# Patient Record
Sex: Female | Born: 1970
Health system: Southern US, Community
[De-identification: ages and names within clinical notes are randomized; demographics above are authoritative.]

## PROBLEM LIST (undated history)

## (undated) DIAGNOSIS — T7840XA Allergy, unspecified, initial encounter: Secondary | ICD-10-CM

## (undated) DIAGNOSIS — G43909 Migraine, unspecified, not intractable, without status migrainosus: Secondary | ICD-10-CM

## (undated) DIAGNOSIS — E039 Hypothyroidism, unspecified: Secondary | ICD-10-CM

## (undated) DIAGNOSIS — Z8639 Personal history of other endocrine, nutritional and metabolic disease: Secondary | ICD-10-CM

## (undated) HISTORY — DX: Personal history of other endocrine, nutritional and metabolic disease: Z86.39

## (undated) HISTORY — PX: APPENDECTOMY: SHX54

## (undated) HISTORY — DX: Hypothyroidism, unspecified: E03.9

## (undated) HISTORY — DX: Allergy, unspecified, initial encounter: T78.40XA

## (undated) HISTORY — PX: TONSILLECTOMY AND ADENOIDECTOMY: SHX28

## (undated) HISTORY — PX: SEPTOPLASTY: SUR1290

## (undated) HISTORY — PX: TONSILLECTOMY: SUR1361

## (undated) HISTORY — PX: WISDOM TOOTH EXTRACTION: SHX21

## (undated) HISTORY — PX: BREAST REDUCTION SURGERY: SHX8

## (undated) HISTORY — PX: TYMPANOPLASTY: SHX33

## (undated) HISTORY — PX: BREAST ENHANCEMENT SURGERY: SHX7

---

## 2007-01-11 ENCOUNTER — Encounter: Admission: RE | Admit: 2007-01-11 | Discharge: 2007-01-11 | Payer: Self-pay | Admitting: Oral Surgery

## 2007-07-31 ENCOUNTER — Ambulatory Visit: Payer: Self-pay | Admitting: Internal Medicine

## 2007-07-31 DIAGNOSIS — D649 Anemia, unspecified: Secondary | ICD-10-CM

## 2007-07-31 DIAGNOSIS — R011 Cardiac murmur, unspecified: Secondary | ICD-10-CM | POA: Insufficient documentation

## 2007-07-31 DIAGNOSIS — E039 Hypothyroidism, unspecified: Secondary | ICD-10-CM | POA: Insufficient documentation

## 2007-07-31 HISTORY — DX: Cardiac murmur, unspecified: R01.1

## 2007-07-31 HISTORY — DX: Anemia, unspecified: D64.9

## 2007-08-01 ENCOUNTER — Encounter: Payer: Self-pay | Admitting: Internal Medicine

## 2007-08-02 LAB — CONVERTED CEMR LAB
ALT: 42 units/L — ABNORMAL HIGH (ref 0–35)
AST: 43 units/L — ABNORMAL HIGH (ref 0–37)
Albumin: 4.5 g/dL (ref 3.5–5.2)
Alkaline Phosphatase: 39 units/L (ref 39–117)
BUN: 9 mg/dL (ref 6–23)
Basophils Absolute: 0.3 10*3/uL — ABNORMAL HIGH (ref 0.0–0.1)
Basophils Relative: 3.5 % — ABNORMAL HIGH (ref 0.0–1.0)
CO2: 29 meq/L (ref 19–32)
Chloride: 107 meq/L (ref 96–112)
Cholesterol: 208 mg/dL (ref 0–200)
Creatinine, Ser: 0.6 mg/dL (ref 0.4–1.2)
Direct LDL: 119 mg/dL
HCT: 34.1 % — ABNORMAL LOW (ref 36.0–46.0)
Hemoglobin: 11.9 g/dL — ABNORMAL LOW (ref 12.0–15.0)
MCHC: 34.8 g/dL (ref 30.0–36.0)
Monocytes Absolute: 0.3 10*3/uL (ref 0.2–0.7)
Monocytes Relative: 4.3 % (ref 3.0–11.0)
Potassium: 3.2 meq/L — ABNORMAL LOW (ref 3.5–5.1)
RBC: 3.69 M/uL — ABNORMAL LOW (ref 3.87–5.11)
RDW: 11.9 % (ref 11.5–14.6)
Total Bilirubin: 0.6 mg/dL (ref 0.3–1.2)
Total CHOL/HDL Ratio: 2.8
Total Protein: 7.1 g/dL (ref 6.0–8.3)
Triglycerides: 75 mg/dL (ref 0–149)
VLDL: 15 mg/dL (ref 0–40)

## 2007-08-06 ENCOUNTER — Encounter (INDEPENDENT_AMBULATORY_CARE_PROVIDER_SITE_OTHER): Payer: Self-pay | Admitting: *Deleted

## 2007-10-25 HISTORY — PX: COLONOSCOPY: SHX174

## 2008-08-26 ENCOUNTER — Ambulatory Visit: Payer: Self-pay | Admitting: Internal Medicine

## 2008-08-26 DIAGNOSIS — R198 Other specified symptoms and signs involving the digestive system and abdomen: Secondary | ICD-10-CM | POA: Insufficient documentation

## 2008-10-01 ENCOUNTER — Ambulatory Visit: Payer: Self-pay | Admitting: Internal Medicine

## 2008-10-01 DIAGNOSIS — K625 Hemorrhage of anus and rectum: Secondary | ICD-10-CM | POA: Insufficient documentation

## 2008-10-02 ENCOUNTER — Encounter (INDEPENDENT_AMBULATORY_CARE_PROVIDER_SITE_OTHER): Payer: Self-pay | Admitting: *Deleted

## 2008-10-02 LAB — CONVERTED CEMR LAB
Basophils Absolute: 0 10*3/uL (ref 0.0–0.1)
Basophils Relative: 0.5 % (ref 0.0–3.0)
Eosinophils Absolute: 0.3 10*3/uL (ref 0.0–0.7)
HCT: 37.6 % (ref 36.0–46.0)
Hemoglobin: 13 g/dL (ref 12.0–15.0)
Lymphocytes Relative: 22.8 % (ref 12.0–46.0)
MCHC: 34.5 g/dL (ref 30.0–36.0)
MCV: 93.9 fL (ref 78.0–100.0)
Monocytes Absolute: 0.5 10*3/uL (ref 0.1–1.0)
Neutro Abs: 3.3 10*3/uL (ref 1.4–7.7)
RBC: 4 M/uL (ref 3.87–5.11)
RDW: 11.5 % (ref 11.5–14.6)

## 2008-11-07 ENCOUNTER — Ambulatory Visit: Payer: Self-pay | Admitting: Internal Medicine

## 2009-02-09 ENCOUNTER — Telehealth (INDEPENDENT_AMBULATORY_CARE_PROVIDER_SITE_OTHER): Payer: Self-pay | Admitting: *Deleted

## 2009-02-11 ENCOUNTER — Telehealth: Payer: Self-pay | Admitting: Internal Medicine

## 2009-02-19 ENCOUNTER — Ambulatory Visit: Payer: Self-pay | Admitting: Internal Medicine

## 2009-03-26 ENCOUNTER — Telehealth (INDEPENDENT_AMBULATORY_CARE_PROVIDER_SITE_OTHER): Payer: Self-pay | Admitting: *Deleted

## 2009-07-15 ENCOUNTER — Telehealth (INDEPENDENT_AMBULATORY_CARE_PROVIDER_SITE_OTHER): Payer: Self-pay | Admitting: *Deleted

## 2009-08-21 ENCOUNTER — Ambulatory Visit: Payer: Self-pay | Admitting: Internal Medicine

## 2009-08-21 DIAGNOSIS — M542 Cervicalgia: Secondary | ICD-10-CM | POA: Insufficient documentation

## 2009-08-21 DIAGNOSIS — G479 Sleep disorder, unspecified: Secondary | ICD-10-CM

## 2009-08-22 DIAGNOSIS — J45909 Unspecified asthma, uncomplicated: Secondary | ICD-10-CM

## 2009-08-22 HISTORY — DX: Unspecified asthma, uncomplicated: J45.909

## 2009-09-09 ENCOUNTER — Ambulatory Visit: Payer: Self-pay | Admitting: Internal Medicine

## 2009-09-10 ENCOUNTER — Ambulatory Visit (HOSPITAL_BASED_OUTPATIENT_CLINIC_OR_DEPARTMENT_OTHER): Admission: RE | Admit: 2009-09-10 | Discharge: 2009-09-10 | Payer: Self-pay | Admitting: Internal Medicine

## 2009-09-12 ENCOUNTER — Encounter: Payer: Self-pay | Admitting: Internal Medicine

## 2009-09-13 ENCOUNTER — Ambulatory Visit: Payer: Self-pay | Admitting: Internal Medicine

## 2009-09-13 DIAGNOSIS — J310 Chronic rhinitis: Secondary | ICD-10-CM

## 2009-09-13 HISTORY — DX: Chronic rhinitis: J31.0

## 2009-10-09 ENCOUNTER — Ambulatory Visit: Payer: Self-pay | Admitting: Internal Medicine

## 2009-10-26 ENCOUNTER — Encounter: Payer: Self-pay | Admitting: Internal Medicine

## 2009-11-20 ENCOUNTER — Telehealth (INDEPENDENT_AMBULATORY_CARE_PROVIDER_SITE_OTHER): Payer: Self-pay | Admitting: *Deleted

## 2010-03-29 ENCOUNTER — Telehealth: Payer: Self-pay | Admitting: Internal Medicine

## 2010-03-29 DIAGNOSIS — D489 Neoplasm of uncertain behavior, unspecified: Secondary | ICD-10-CM | POA: Insufficient documentation

## 2010-04-12 ENCOUNTER — Telehealth: Payer: Self-pay | Admitting: Internal Medicine

## 2010-04-12 ENCOUNTER — Encounter: Payer: Self-pay | Admitting: Internal Medicine

## 2010-07-22 ENCOUNTER — Encounter (INDEPENDENT_AMBULATORY_CARE_PROVIDER_SITE_OTHER): Payer: Self-pay | Admitting: *Deleted

## 2010-09-06 ENCOUNTER — Ambulatory Visit: Payer: Self-pay | Admitting: Internal Medicine

## 2010-09-06 DIAGNOSIS — L299 Pruritus, unspecified: Secondary | ICD-10-CM | POA: Insufficient documentation

## 2010-09-06 DIAGNOSIS — L503 Dermatographic urticaria: Secondary | ICD-10-CM

## 2010-09-06 DIAGNOSIS — F39 Unspecified mood [affective] disorder: Secondary | ICD-10-CM | POA: Insufficient documentation

## 2010-09-06 HISTORY — DX: Dermatographic urticaria: L50.3

## 2010-11-23 NOTE — Consult Note (Signed)
Summary: [facility]   Imported By: Lanelle Bal 04/21/2010 10:07:36  _____________________________________________________________________  External Attachment:    Type:   Image     Comment:   External Document

## 2010-11-23 NOTE — Progress Notes (Signed)
Summary: Poditrist Referral  Phone Note Call from Patient Call back at Work Phone [phone]   Caller: Patient Reason for Call: Referral Summary of Call: Patient would like a referral to a poditrist, she has a growth on the top of her left foot and it hurts. She does not want to come in, she just wants the referral.    Initial call taken by: Harold Barban,  March 29, 2010 12:56 PM  Follow-up for Phone Call        Dr.Ellisyn Icenhower please advise Follow-up by: Shonna Chock,  March 29, 2010 1:21 PM  Additional Follow-up for Phone Call Additional follow up Details #1::        see Referral  New Problems: NEOPLASM OF UNCERTAIN BEHAVIOR SITE UNSPECIFIED (ICD-238.9)   New Problems: NEOPLASM OF UNCERTAIN BEHAVIOR SITE UNSPECIFIED (ICD-238.9)

## 2010-11-23 NOTE — Progress Notes (Signed)
Summary: nos appt  Phone Note Call from Patient   Caller: [email or web address]  Call For: Deshonda Cryderman Summary of Call: LMTCB x2 to rsc from 6/17. Initial call taken by: Darletta Moll,  April 12, 2010 2:49 PM

## 2010-11-23 NOTE — Assessment & Plan Note (Signed)
Summary: FOR ITCHY SKIN//PH   Vital Signs:  Patient profile:   40 year old female Weight:      136 pounds BMI:     23.07 Temp:     98.3 degrees F oral Pulse rate:   72 / minute Resp:     15 per minute BP sitting:   94 / 60  (left arm) Cuff size:   large  Vitals Entered By: Shonna Chock CMA (September 06, 2010 9:10 AM) CC: Itchy skin, patient seen recommended specialist and no relief, Rash   Primary Care Provider:  Dr Alwyn Ren  CC:  Itchy skin, patient seen recommended specialist and no relief, and Rash.  History of Present Illness: She has seen Dr Courtney Heys, ENT/ Allergy  & Dr Fannie Knee for diffuse pruritis; she is  on sublingual agents for documented allergies.The pruritis is now disturbing her sleep; she questions stress as etiology.Rx: Benadryl & hydroxyzine have been somewhat beneficial.A famiy member gave her Lorazepam which was effective.  The patient reports welts only after scratching. The  itching  is located on the face/trunk/limbs diffusely.  The patient denies the following symptoms: fever, headache, facial swelling, tongue swelling, difficulty breathing, abdominal pain, nausea, vomiting, diarrhea, and arthralgias.    Current Medications (verified): 1)  None  Allergies: 1)  ! Codeine  Physical Exam  General:  well-nourished,in no acute distress; alert,appropriate and cooperative throughout examination Eyes:  No corneal or conjunctival inflammation noted. Perrla. Nose:  External nasal examination shows no deformity or inflammation. Nasal mucosa are pink and moist without lesions or exudates. Mouth:  Oral mucosa and oropharynx without lesions or exudates.  Teeth in good repair. L soft palate lower than R Lungs:  Normal respiratory effort, chest expands symmetrically. Lungs are clear to auscultation, no crackles or wheezes. Heart:  regular rhythm, no murmur, no gallop, no rub, no JVD, and bradycardia.   Abdomen:  Bowel sounds positive,abdomen soft and non-tender without  masses, organomegaly or hernias noted. Skin:  Dermatographia Cervical Nodes:  No lymphadenopathy noted Axillary Nodes:  No palpable lymphadenopathy Psych:  memory intact for recent and remote, normally interactive, good eye contact, not anxious appearing, and not depressed appearing.     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 1/2-1 at bedtime prn 2)  Citalopram Hydrobromide 20 Mg Tabs (Citalopram hydrobromide) .Marland Kitchen.. 1 once daily 3)  Sumatriptan Succinate 50 Mg Tabs (Sumatriptan succinate) .Marland Kitchen.. 1 once daily as needed  Patient Instructions: 1)  Assess response to medical regimen Prescriptions: CITALOPRAM HYDROBROMIDE 20 MG TABS (CITALOPRAM HYDROBROMIDE) 1 once daily  #30 x 2   Entered and Authorized by:   Marga Melnick MD   Signed by:   Marga Melnick MD on 09/06/2010   Method used:   Print then Give to Patient   RxID:   [id number] LORAZEPAM 0.5 MG TABS (LORAZEPAM) 1/2-1 at bedtime prn  #30 x 2   Entered and Authorized by:   Marga Melnick MD   Signed by:   Marga Melnick MD on 09/06/2010   Method used:   Print then Give to Patient   RxID:   [id number]    Orders Added: 1)  Est. Patient Level III [95284]

## 2010-11-23 NOTE — Miscellaneous (Signed)
Summary: pnuemovax  Clinical Lists Changes  Observations: Added new observation of PNEUMOVAX: given at [facility] (07/22/2010 9:53)      Immunization History:  Pneumovax Immunization History:    Pneumovax:  given at [facility] (07/22/2010)

## 2010-11-23 NOTE — Progress Notes (Signed)
Summary: not available PANLOR DC 356.02-21-15 MG   Phone Note Refill Request   Refills Requested: Medication #1:  PANLOR DC 356.02-21-15 MG CAPS take 1-2 q 4 hrs as needed. [facility] [address]--PH-[phone]  Initial call taken by: Freddy Jaksch,  February 11, 2009 2:25 PM  Follow-up for Phone Call        wait for PCP, got 12 tabs 2 days ago Rossiter E. Paz MD  February 11, 2009 3:53 PM  Follow-up by: Marga Melnick MD,  February 11, 2009 4:12 PM  Additional Follow-up for Phone Call Additional follow up Details #1::        the headaches require evaluation with this frequency/ level of pain med  Additional Follow-up by: Marga Melnick MD,  February 11, 2009 4:13 PM    Additional Follow-up for Phone Call Additional follow up Details #2::    dr Nichol Ator please advise   med no longer available per pharmacy please advise on new med.original rx  never filled or pick-up......................Marland KitchenFelecia Deloach CMA  February 12, 2009 12:11 PM  Additional Follow-up for Phone Call Additional follow up Details #3:: Details for Additional Follow-up Action Taken: Darvocet N100 #30 1-2 q 4-6 hrs as needed  Additional Follow-up by: Marga Melnick MD,  February 12, 2009 2:01 PM  New/Updated Medications: DARVOCET-N 100 100-650 MG TABS (PROPOXYPHENE N-APAP) take 1-2 q 4-6 hrs as needed   Prescriptions: DARVOCET-N 100 100-650 MG TABS (PROPOXYPHENE N-APAP) take 1-2 q 4-6 hrs as needed  #30 x 0   Entered by:   Jeremy Johann CMA   Authorized by:   Marga Melnick MD   Signed by:   Jeremy Johann CMA on 02/12/2009   Method used:   Printed then faxed to ...       [facility] [address] [id number]* (retail)       [address]       [place], [place]  [address]       Ph: [phone]       Fax: [phone]   RxID:   [id number]

## 2010-11-23 NOTE — Progress Notes (Signed)
Summary:  change to generic med  Phone Note Refill Request Message from:  Patient  Refills Requested: Medication #1:  SYMBICORT 160-4.5 MCG/ACT AERO 1-2 inhalations eveery 12 hrs pt states that insurance will not covered med and would like to get it change to something generic that insurance will cover. dr hopper pls advise................Marland KitchenFelecia Deloach CMA  November 20, 2009 10:30 AM   pt use [facility] [place]  Initial call taken by: Jeremy Johann CMA,  November 20, 2009 10:30 AM  Follow-up for Phone Call        which are drug plan options as per her Pharmacist Follow-up by: Marga Melnick MD,  November 20, 2009 4:36 PM  Additional Follow-up for Phone Call Additional follow up Details #1::        PT WILL CONTACT PHARAMACY TO GET DRUG OPTION FAXED TO [place]. .............Marland KitchenFelecia Deloach CMA  November 20, 2009 5:07 PM

## 2010-11-23 NOTE — Letter (Signed)
Summary: [facility]  [facility]   Imported By: Lanelle Bal 11/02/2009 09:50:54  _____________________________________________________________________  External Attachment:    Type:   Image     Comment:   External Document

## 2011-07-15 ENCOUNTER — Other Ambulatory Visit: Payer: Self-pay | Admitting: Internal Medicine

## 2011-07-15 ENCOUNTER — Ambulatory Visit (INDEPENDENT_AMBULATORY_CARE_PROVIDER_SITE_OTHER): Payer: BC Managed Care – PPO | Admitting: Internal Medicine

## 2011-07-15 ENCOUNTER — Encounter: Payer: Self-pay | Admitting: Internal Medicine

## 2011-07-15 VITALS — BP 112/68 | HR 102 | Temp 98.3°F | Resp 12 | Ht 64.75 in | Wt 150.0 lb

## 2011-07-15 DIAGNOSIS — Z0000 Encounter for general adult medical examination without abnormal findings: Secondary | ICD-10-CM

## 2011-07-15 DIAGNOSIS — Z136 Encounter for screening for cardiovascular disorders: Secondary | ICD-10-CM

## 2011-07-15 NOTE — Progress Notes (Signed)
Subjective:    Patient ID: Sheryl Nichols, female    DOB: 12/08/1970, 40 y.o.   MRN: [id number]  HPI  Sheryl Nichols  is here for a physical;acute issues include fatigue     Review of Systems FATIGUE: Onset: since mid May   Fatigue @ rest : yes , but worse  with exertion  Primarily motivational fatigue: yes  Primarily physical fatigue: no Symptoms: Fever/ chills : no  Night sweats: no                                                                                            Vision changes ( blurred/ double/ loss): no                                                                                                  Hoarseness or swallowing dysfunction: no                                                                                         Bowel changes( constipation/ diarrhea): no                                                                                     Weight change: yes, 12 # gain since last OV   Exertional chest pain: no  Dyspnea on exertion: no  Cough: no  Hemoptysis: no  New medications: no  Leg swelling: no  Orthopnea: no  PND: no  Melena/ rectal bleeding: no  Adenopathy: no  Severe snoring: no ; Neti pot used for congestion Daytime sleepiness: yes,   Skin / hair / nail changes: no  Temperature intolerance( heat/ cold) : cold intolerance  Feeling depressed: no  Anhedonia: no  Altered appetite: stays hungry  Poor sleep/ Apnea : no ( official test was neg) Abnormal bruising / bleeding or enlarged lymph nodes: no                                                                         PMH/ FH of thyroid disease: yes; mother low thyroid    She strained her back in May;this  initially improved but it has progressed since with pain in the R buttock. She'll be seen Dr. Shon Baton, and orthopedic back specialist. She's been self treating it with heat, cold,muscle relaxant & NSAIDS.        Objective:   Physical Exam Gen.: Healthy and well-nourished in appearance. Alert, appropriate and cooperative throughout exam. Head: Normocephalic without obvious abnormalities  Eyes: No corneal or conjunctival inflammation noted. Pupils equal round reactive to light and accommodation. Fundal exam is benign without hemorrhages, exudate, papilledema. Extraocular motion intact. Vision grossly normal. No lid lag Ears: External  ear exam reveals no significant lesions or deformities. Canals clear .TMs normal. Hearing is grossly normal bilaterally. Nose: External nasal exam reveals no deformity or inflammation. Nasal mucosa are pink and moist. No lesions or exudates noted. Septum  normal  Mouth: Oral mucosa and oropharynx reveal no lesions or exudates. Teeth in good repair. The left posterior pharynx is slightly lower than the right; there is no airway compromise. Neck: No deformities, masses, or tenderness noted. Range of motion &. Thyroid  normal. Lungs: Normal respiratory effort; chest expands symmetrically. Lungs are clear to auscultation without rales, wheezes, or increased work of breathing. Heart: Normal rate and rhythm. Normal S1 and S2. No gallop, click, or rub. Grade 1/2 -1 systolic  murmur. Abdomen: Bowel sounds normal; abdomen soft and nontender. No masses, organomegaly or hernias noted. Genitalia: Dr Billy Coast   .                                                                                   Musculoskeletal/extremities: No deformity or scoliosis noted of  the thoracic or lumbar spine. No clubbing, cyanosis, edema, or deformity noted. Range of motion  normal .Tone & strength  normal.Joints normal. Nail health  good. She is able to lie back on exam table and sit up without help. She has negative straight leg raising to 90 bilaterally. Vascular: Carotid, radial artery, dorsalis pedis and  posterior tibial pulses are full and equal. No bruits present. Neurologic: Alert and oriented x3. Deep  tendon reflexes symmetrical and normal. Gait including heel and toe walking is normal         Skin: Intact without suspicious lesions or rashes. Lymph: No cervical, axillary lymphadenopathy present. Psych: Mood and affect are normal. Normally interactive  Assessment & Plan:  #1 comprehensive physical exam; no acute findings #2 see Problem List with Assessments & Recommendations #3 fatigue #4 right buttocks pain; rule out piriformis syndrome. No evidence of disc issues. Plan: see Orders

## 2011-07-15 NOTE — Patient Instructions (Signed)
Preventive Health Care: Exercise  30-45  minutes a day, 3-4 days a week. Walking is especially valuable in preventing Osteoporosis. Eat a low-fat diet with lots of fruits and vegetables, up to 7-9 servings per day. Consume less than 30 grams of sugar per day from foods & drinks with High Fructose Corn Syrup as # 1,2,3 or #4 on label.  The best exercises for the low back include freestyle swimming, stretch aerobics, and yoga.  Please  schedule fasting Labs : BMET,Lipids, hepatic panel, CBC & dif, TSH, free T4 (V70.0)

## 2011-07-18 ENCOUNTER — Other Ambulatory Visit: Payer: Self-pay | Admitting: Internal Medicine

## 2011-07-18 ENCOUNTER — Other Ambulatory Visit: Payer: BC Managed Care – PPO

## 2011-07-18 NOTE — Progress Notes (Signed)
ERROR orders already placed as future orders.

## 2011-07-19 ENCOUNTER — Other Ambulatory Visit (INDEPENDENT_AMBULATORY_CARE_PROVIDER_SITE_OTHER): Payer: BC Managed Care – PPO

## 2011-07-19 DIAGNOSIS — Z0000 Encounter for general adult medical examination without abnormal findings: Secondary | ICD-10-CM

## 2011-07-19 LAB — CBC WITH DIFFERENTIAL/PLATELET
Basophils Absolute: 0 10*3/uL (ref 0.0–0.1)
Lymphocytes Relative: 35.9 % (ref 12.0–46.0)
Lymphs Abs: 1.7 10*3/uL (ref 0.7–4.0)
MCHC: 33.6 g/dL (ref 30.0–36.0)
MCV: 95.7 fl (ref 78.0–100.0)
Neutrophils Relative %: 46.7 % (ref 43.0–77.0)
Platelets: 280 10*3/uL (ref 150.0–400.0)
WBC: 4.6 10*3/uL (ref 4.5–10.5)

## 2011-07-19 LAB — BASIC METABOLIC PANEL
CO2: 26 mEq/L (ref 19–32)
Calcium: 9.4 mg/dL (ref 8.4–10.5)
Creatinine, Ser: 0.7 mg/dL (ref 0.4–1.2)
GFR: 98.63 mL/min (ref >60.00)
Sodium: 145 mEq/L (ref 135–145)

## 2011-07-19 LAB — HEPATIC FUNCTION PANEL
ALT: 45 U/L — ABNORMAL HIGH (ref 0–35)
AST: 37 U/L (ref 0–37)
Bilirubin, Direct: 0 mg/dL (ref 0.0–0.3)
Total Bilirubin: 0.4 mg/dL (ref 0.3–1.2)
Total Protein: 7.2 g/dL (ref 6.0–8.3)

## 2011-07-19 LAB — LIPID PANEL
HDL: 76.8 mg/dL (ref >39.00)
Triglycerides: 69 mg/dL (ref 0.0–149.0)

## 2011-07-19 NOTE — Progress Notes (Signed)
Labs only

## 2011-07-20 NOTE — Progress Notes (Signed)
Labs only

## 2012-02-15 ENCOUNTER — Emergency Department (INDEPENDENT_AMBULATORY_CARE_PROVIDER_SITE_OTHER): Payer: BC Managed Care – PPO

## 2012-02-15 ENCOUNTER — Encounter (HOSPITAL_BASED_OUTPATIENT_CLINIC_OR_DEPARTMENT_OTHER): Payer: Self-pay | Admitting: Emergency Medicine

## 2012-02-15 ENCOUNTER — Emergency Department (HOSPITAL_BASED_OUTPATIENT_CLINIC_OR_DEPARTMENT_OTHER)
Admission: EM | Admit: 2012-02-15 | Discharge: 2012-02-15 | Disposition: A | Discharge status: Home / Self Care | Payer: BC Managed Care – PPO | Attending: Emergency Medicine | Admitting: Emergency Medicine

## 2012-02-15 DIAGNOSIS — J45909 Unspecified asthma, uncomplicated: Secondary | ICD-10-CM | POA: Insufficient documentation

## 2012-02-15 DIAGNOSIS — IMO0002 Reserved for concepts with insufficient information to code with codable children: Secondary | ICD-10-CM | POA: Insufficient documentation

## 2012-02-15 DIAGNOSIS — E039 Hypothyroidism, unspecified: Secondary | ICD-10-CM | POA: Insufficient documentation

## 2012-02-15 DIAGNOSIS — H539 Unspecified visual disturbance: Secondary | ICD-10-CM

## 2012-02-15 DIAGNOSIS — R51 Headache: Secondary | ICD-10-CM

## 2012-02-15 DIAGNOSIS — M792 Neuralgia and neuritis, unspecified: Secondary | ICD-10-CM

## 2012-02-15 HISTORY — DX: Migraine, unspecified, not intractable, without status migrainosus: G43.909

## 2012-02-15 LAB — DIFFERENTIAL
Eosinophils Relative: 2 % (ref 0–5)
Lymphocytes Relative: 36 % (ref 12–46)
Lymphs Abs: 3 10*3/uL (ref 0.7–4.0)
Monocytes Absolute: 0.5 10*3/uL (ref 0.1–1.0)
Monocytes Relative: 6 % (ref 3–12)
Neutro Abs: 4.6 10*3/uL (ref 1.7–7.7)

## 2012-02-15 LAB — BASIC METABOLIC PANEL
CO2: 30 mEq/L (ref 19–32)
Calcium: 10.5 mg/dL (ref 8.4–10.5)
Chloride: 100 mEq/L (ref 96–112)
Creatinine, Ser: 0.6 mg/dL (ref 0.50–1.10)
Glucose, Bld: 84 mg/dL (ref 70–99)

## 2012-02-15 LAB — CBC
HCT: 42 % (ref 36.0–46.0)
Hemoglobin: 14.8 g/dL (ref 12.0–15.0)
MCV: 91.9 fL (ref 78.0–100.0)
RBC: 4.57 MIL/uL (ref 3.87–5.11)
WBC: 8.2 10*3/uL (ref 4.0–10.5)

## 2012-02-15 MED ORDER — IBUPROFEN 800 MG PO TABS
800.0000 mg | ORAL_TABLET | Freq: Once | ORAL | Status: AC
  Administered 2012-02-15: 800 mg via ORAL
  Filled 2012-02-15: qty 1

## 2012-02-15 MED ORDER — SODIUM CHLORIDE 0.9 % IV BOLUS (SEPSIS)
1000.0000 mL | Freq: Once | INTRAVENOUS | Status: AC
  Administered 2012-02-15: 1000 mL via INTRAVENOUS

## 2012-02-15 NOTE — Discharge Instructions (Signed)
Occipital Neuralgia Neuralgias are attacks of sharp stabbing pain. They may be intermittent (comes and goes) or constant in nature. They may be brief attacks that last seconds to minutes and may come back for days to weeks. The neuralgias can occur as a result of a herpes zoster (shingles), chickenpox infection, or even following a herpes simplex infection (cold sore). TYPES OF NEURALGIA  When these pains are located in the back of the head and neck they are called occipital neuralgias.   When the pain is located between ribs it is called intercostal neuralgia.   When the pain is located in the face it is called trigeminal neuralgia. This is the most common neuralgia. It causes sharp, shock like pain on one side of your face.  The neuralgias, which follow herpes zoster infections, often produce a constant burning pain. They may last from weeks to months and even years. The attacks of pain may come from injury or inflammation (irritation) to a nerve. Often the cause is unknown. The episodes of pain may be caused by light touch, movement, or even eating and sneezing. Usually these neuralgias occur after age forty. The neuralgias following shingles and trigeminal neuralgia are the most common. Although painful, these episodes do not threaten life and tend to lessen as we grow older. TREATMENT  There are many medications that may be helpful in the treatment of this disorder. Sometimes several medications may have to be tried before the right combination can be found for you. Some of these medications are:  Only take over-the-counter or prescription medications for pain, discomfort, or fever as directed by your caregiver.   Narcotic medications may be used to control the pain.   Antidepressants and medications used in epilepsy (seizure disorders) may be useful.  LET YOUR CAREGIVER KNOW ABOUT:  If you do not obtain relief from medications.   Problems that are getting worse rather than better.    Troubling side effects that you think are coming from the medication.  Do not be discouraged if you do not obtain instant relief from the medications or help given you. Your caregiver can help you get through these episodes of pain with some persistence (continued trying) on your part also. Document Released: 10/04/2001 Document Revised: 09/29/2011 Document Reviewed: 10/10/2005 Exit[employer] Patient Information 2012 [employer]. 

## 2012-02-15 NOTE — ED Provider Notes (Signed)
History     CSN: [id number]  Arrival date & time 02/15/12  1128   First MD Initiated Contact with Patient 02/15/12 1201      Chief Complaint  Patient presents with  . Headache    (Consider location/radiation/quality/duration/timing/severity/associated sxs/prior treatment) HPI  Patient c.ol of headache at base of neck sharp intermittent shooting pain.  Patient denies headache behind eyes as noted in nursing note.  No fever, or chills, focal deficits, nasal discharge, syncope, or trauma.  Patient with history of migraines but states not like prior migraines.    Past Medical History  Diagnosis Date  . Hypothyroidism     Never treated with medications  . Asthma   . Migraines     Past Surgical History  Procedure Date  . Septoplasty     sinus cystectomy  . Tonsillectomy   . Breast enhancement surgery   . Tympanoplasty   . Tonsils and addenoids     Family History  Problem Relation Age of Onset  . Adopted: Yes  . Hyperlipidemia Mother   . Cancer Maternal Grandfather     Brain Cancer  . Hypertension Mother   . Heart attack Mother 39  . Thyroid disease Mother     HYPOthyroid    History  Substance Use Topics  . Smoking status: Never Smoker   . Smokeless tobacco: Not on file  . Alcohol Use: 4.2 oz/week    7 Glasses of wine per week    OB History    Grav Para Term Preterm Abortions TAB SAB Ect Mult Living                  Review of Systems  All other systems reviewed and are negative.    Allergies  Codeine  Home Medications  No current outpatient prescriptions on file.  BP 105/71  Pulse 106  Temp(Src) 97.8 F (36.6 C) (Oral)  Resp 18  Ht 5\' 4"  (1.626 m)  Wt 136 lb (61.689 kg)  BMI 23.34 kg/m2  SpO2 100%  LMP 02/03/2012  Physical Exam  Nursing note and vitals reviewed. Constitutional: She appears well-developed and well-nourished.  HENT:  Head: Normocephalic and atraumatic.  Eyes: Conjunctivae and EOM are normal. Pupils are equal, round, and  reactive to light.  Neck: Normal range of motion. Neck supple.  Cardiovascular: Normal rate, regular rhythm, normal heart sounds and intact distal pulses.   Pulmonary/Chest: Effort normal and breath sounds normal.  Abdominal: Soft. Bowel sounds are normal.  Musculoskeletal: Normal range of motion.  Neurological: She is alert.  Skin: Skin is warm and dry.  Psychiatric: She has a normal mood and affect. Thought content normal.    ED Course  Procedures (including critical care time)   Labs Reviewed  CBC  DIFFERENTIAL  BASIC METABOLIC PANEL  PREGNANCY, URINE   Ct Head Wo Contrast  02/15/2012  *RADIOLOGY REPORT*  Clinical Data:  Headaches with visual difficulty.  No history of injury.  CT HEAD WITHOUT CONTRAST  Technique:  Contiguous axial images were obtained from the base of the skull through the vertex without contrast  Comparison:  None.  Findings:  The brain has a normal appearance without evidence for hemorrhage, acute infarction, hydrocephalus, or mass lesion.  There is no extra axial fluid collection.  The skull is normal.  There appears to have been bilateral maxillary sinus surgery without visible sinus air-fluid level.  IMPRESSION: Normal CT of the head without contrast.  Original Report Authenticated By: Elsie Stain, M.D.  No diagnosis found.    MDM  Patient with symptoms consistent with neuralgia. She has not have any evidence of acute abdomen on and her head CT and labs are essentially normal. Patient is discharged home with advice to use       Hilario Quarry, MD 02/20/12 [phone]

## 2012-02-15 NOTE — ED Notes (Signed)
Pt acuity changed from 4 to 3 based on plan of care. 

## 2012-02-15 NOTE — ED Notes (Signed)
States headache started last night.  Vomited x1 this am.  Headache is located behind her eyes.

## 2012-02-15 NOTE — ED Notes (Signed)
Pt ambulatory with steady gait to restroom 

## 2012-08-17 ENCOUNTER — Other Ambulatory Visit: Payer: Self-pay | Admitting: Obstetrics and Gynecology

## 2012-08-17 DIAGNOSIS — Z1231 Encounter for screening mammogram for malignant neoplasm of breast: Secondary | ICD-10-CM

## 2012-08-21 ENCOUNTER — Ambulatory Visit
Admission: RE | Admit: 2012-08-21 | Discharge: 2012-08-21 | Disposition: A | Discharge status: Home / Self Care | Payer: BC Managed Care – PPO | Source: Ambulatory Visit | Attending: Obstetrics and Gynecology | Admitting: Obstetrics and Gynecology

## 2012-08-21 DIAGNOSIS — Z1231 Encounter for screening mammogram for malignant neoplasm of breast: Secondary | ICD-10-CM

## 2013-06-04 ENCOUNTER — Ambulatory Visit (INDEPENDENT_AMBULATORY_CARE_PROVIDER_SITE_OTHER): Payer: BC Managed Care – PPO | Admitting: Internal Medicine

## 2013-06-04 ENCOUNTER — Encounter: Payer: Self-pay | Admitting: Internal Medicine

## 2013-06-04 VITALS — BP 110/72 | HR 91 | Temp 98.0°F | Ht 64.5 in | Wt 151.0 lb

## 2013-06-04 DIAGNOSIS — D649 Anemia, unspecified: Secondary | ICD-10-CM

## 2013-06-04 DIAGNOSIS — Z23 Encounter for immunization: Secondary | ICD-10-CM

## 2013-06-04 DIAGNOSIS — G5600 Carpal tunnel syndrome, unspecified upper limb: Secondary | ICD-10-CM | POA: Insufficient documentation

## 2013-06-04 DIAGNOSIS — K625 Hemorrhage of anus and rectum: Secondary | ICD-10-CM

## 2013-06-04 DIAGNOSIS — J45909 Unspecified asthma, uncomplicated: Secondary | ICD-10-CM

## 2013-06-04 DIAGNOSIS — G479 Sleep disorder, unspecified: Secondary | ICD-10-CM

## 2013-06-04 HISTORY — DX: Carpal tunnel syndrome, unspecified upper limb: G56.00

## 2013-06-04 NOTE — Patient Instructions (Addendum)
Please consider scheduling fasting Labs if not done by Gyn : BMET,Lipids, hepatic panel, CBC & dif, TSH. Code: V70.0.  If you activate the  My Chart system; lab & Xray results will be released directly  to you as soon as I review & address these through the computer. If you choose not to sign up for My Chart within 36 hours of labs being drawn; results will be reviewed & interpretation added before being copied & mailed, causing a delay in getting the results to you.If you do not receive that report within 7-10 days ,please call. Additionally you can use this system to gain direct  access to your records  if  out of town or @ an office of a  physician who is not in  the My Chart network.  This improves continuity of care & places you in control of your medical record.

## 2013-06-04 NOTE — Progress Notes (Signed)
  Subjective:    Patient ID: Sheryl Nichols, female    DOB: 06-Aug-1971, 42 y.o.   MRN: [id number]  HPI  She is here for a physical;acute issues isolated throbbing LLQ pain 8/11 & today. It is sharp with palpation.It is worse with certain position changes.She is pre menstrual.     Review of Systems She denies unexplained weight loss ( see below), melena, or rectal bleeding. She has had  no significant bowel changes; hematuria,dysuria or pyuria. She questioned whether this might be related to ovulation. She has radically changed her diet; this is involved dramatic reduction in sugar intake for her and her family. This has resulted in resolution of her mood swings,fatigue, migraines, and acneiform dermatitis. She has lost 30# .     Objective:   Physical Exam Gen.: Healthy and well-nourished in appearance. Alert, appropriate and cooperative throughout exam. Appears younger than stated age  Head: Normocephalic without obvious abnormalities  Eyes: No corneal or conjunctival inflammation noted. Pupils equal round reactive to light and accommodation.Extraocular motion intact. Vision grossly normal without lenses. No icterus Ears: External  ear exam reveals no significant lesions or deformities. Canals clear .TMs normal. Hearing is grossly normal bilaterally. Nose: External nasal exam reveals no deformity or inflammation. Nasal mucosa are pink and moist. No lesions or exudates noted.   Mouth: Oral mucosa and oropharynx reveal no lesions or exudates. Teeth in good repair. Neck: No deformities, masses, or tenderness noted. Range of motion & Thyroid normal. Lungs: Normal respiratory effort; chest expands symmetrically. Lungs are clear to auscultation without rales, wheezes, or increased work of breathing. Heart: Normal rate and rhythm. Normal S1 and S2. No gallop, click, or rub. S4 w/o murmur. Abdomen: Bowel sounds normal; abdomen soft  But slightly tender LLQ. No masses, organomegaly or hernias  noted. Genitalia: As per Gyn                                  Musculoskeletal/extremities: No deformity or scoliosis noted of  the thoracic or lumbar spine.  No clubbing, cyanosis, edema, or significant extremity  deformity noted. Range of motion normal .Tone & strength  Normal. Joints normal . Nail health good. Able to lie down & sit up w/o help. Negative SLR bilaterally Vascular: Carotid, radial artery, dorsalis pedis and  posterior tibial pulses are full and equal. No bruits present. Neurologic: Alert and oriented x3. Deep tendon reflexes symmetrical and normal.         Skin: Intact without suspicious lesions or rashes. Lymph: No cervical, axillary lymphadenopathy present. Psych: Mood and affect are normal. Normally interactive                                                                                       Assessment & Plan:  #1 comprehensive physical exam; no acute findings #2 LLQ discomfort , probably ovulatory. CBC & dif , UA, +/- [place] if progressive.  Plan: see Orders  & Recommendations

## 2013-08-29 ENCOUNTER — Other Ambulatory Visit: Payer: Self-pay

## 2013-10-07 ENCOUNTER — Other Ambulatory Visit: Payer: Self-pay

## 2013-10-07 DIAGNOSIS — Z1231 Encounter for screening mammogram for malignant neoplasm of breast: Secondary | ICD-10-CM

## 2013-11-04 ENCOUNTER — Other Ambulatory Visit: Payer: Self-pay

## 2013-11-04 DIAGNOSIS — Z1231 Encounter for screening mammogram for malignant neoplasm of breast: Secondary | ICD-10-CM

## 2013-11-04 DIAGNOSIS — Z9882 Breast implant status: Secondary | ICD-10-CM

## 2013-11-08 ENCOUNTER — Ambulatory Visit
Admission: RE | Admit: 2013-11-08 | Discharge: 2013-11-08 | Disposition: A | Discharge status: Home / Self Care | Payer: BC Managed Care – PPO | Source: Ambulatory Visit

## 2013-11-08 ENCOUNTER — Other Ambulatory Visit: Payer: Self-pay | Admitting: Internal Medicine

## 2013-11-08 DIAGNOSIS — Z1231 Encounter for screening mammogram for malignant neoplasm of breast: Secondary | ICD-10-CM

## 2013-11-08 DIAGNOSIS — N63 Unspecified lump in unspecified breast: Secondary | ICD-10-CM

## 2013-11-08 DIAGNOSIS — Z9882 Breast implant status: Secondary | ICD-10-CM

## 2013-11-21 ENCOUNTER — Other Ambulatory Visit: Payer: Self-pay | Admitting: Internal Medicine

## 2013-11-21 ENCOUNTER — Ambulatory Visit
Admission: RE | Admit: 2013-11-21 | Discharge: 2013-11-21 | Disposition: A | Discharge status: Home / Self Care | Payer: BC Managed Care – PPO | Source: Ambulatory Visit | Attending: Internal Medicine | Admitting: Internal Medicine

## 2013-11-21 DIAGNOSIS — N63 Unspecified lump in unspecified breast: Secondary | ICD-10-CM

## 2013-11-27 ENCOUNTER — Ambulatory Visit (INDEPENDENT_AMBULATORY_CARE_PROVIDER_SITE_OTHER): Payer: BC Managed Care – PPO | Admitting: Family Medicine

## 2013-11-27 ENCOUNTER — Encounter: Payer: Self-pay | Admitting: Family Medicine

## 2013-11-27 VITALS — BP 110/80 | HR 69 | Temp 98.0°F | Resp 16 | Wt 156.2 lb

## 2013-11-27 DIAGNOSIS — E039 Hypothyroidism, unspecified: Secondary | ICD-10-CM

## 2013-11-27 MED ORDER — LEVOTHYROXINE SODIUM 100 MCG PO TABS: 100.0000 ug | ORAL_TABLET | Freq: Every day | ORAL | Status: DC

## 2013-11-27 NOTE — Patient Instructions (Signed)
Schedule a lab visit in 1 month Increase the Synthroid to 128mcg- 2 tabs of current med, 1 of new script We'll call you with your [place] appt Call with any questions or concerns Welcome!  We're glad to have you!

## 2013-11-27 NOTE — Progress Notes (Signed)
   Subjective:    Patient ID: Sheryl Nichols, female    DOB: Jun 06, 1971, 43 y.o.   MRN: [id number]  HPI Hypothyroid- dx'd at GYN ([facility]).  Started on Synthroid 2mcg and then increased to 67mcg.  Recent TSH 5.9 and that was on 27mcg daily.  That was an increase over last TSH.  + fatigue, 'i'm feeling horrible'.  Hair is falling out, nails are dry and brittle, has gained 30 lbs.  Husband made comment that she appears to have aged.     Review of Systems For ROS see HPI     Objective:   Physical Exam  Vitals reviewed. Constitutional: She is oriented to person, place, and time. She appears well-developed and well-nourished. No distress.  HENT:  Head: Normocephalic and atraumatic.  Eyes: Conjunctivae and EOM are normal. Pupils are equal, round, and reactive to light.  Neck: Normal range of motion. Neck supple. Thyromegaly (mild thyromegaly) present.  Cardiovascular: Normal rate, regular rhythm, normal heart sounds and intact distal pulses.   No murmur heard. Pulmonary/Chest: Effort normal and breath sounds normal. No respiratory distress.  Abdominal: Soft. She exhibits no distension. There is no tenderness.  Musculoskeletal: She exhibits no edema.  Lymphadenopathy:    She has no cervical adenopathy.  Neurological: She is alert and oriented to person, place, and time.  Skin: Skin is warm and dry.  Psychiatric: She has a normal mood and affect. Her behavior is normal.          Assessment & Plan:

## 2013-11-27 NOTE — Progress Notes (Signed)
Pre visit review using our clinic review tool, if applicable. No additional management support is needed unless otherwise documented below in the visit note. 

## 2013-11-27 NOTE — Assessment & Plan Note (Signed)
New to provider, ongoing for pt.  Dose recently increased to 36mcg daily but pt continues to suffer from classic hypothyroid sxs.  Increase to 165mcg and repeat TSH in 1 month.  Will get [place] to assess mild thyromegaly.  Will follow closely.  Pt expressed understanding and is in agreement w/ plan.

## 2013-11-29 ENCOUNTER — Ambulatory Visit (HOSPITAL_BASED_OUTPATIENT_CLINIC_OR_DEPARTMENT_OTHER)
Admission: RE | Admit: 2013-11-29 | Discharge: 2013-11-29 | Disposition: A | Discharge status: Home / Self Care | Payer: BC Managed Care – PPO | Source: Ambulatory Visit | Attending: Family Medicine | Admitting: Family Medicine

## 2013-11-29 DIAGNOSIS — E039 Hypothyroidism, unspecified: Secondary | ICD-10-CM

## 2013-11-29 DIAGNOSIS — E049 Nontoxic goiter, unspecified: Secondary | ICD-10-CM | POA: Insufficient documentation

## 2013-11-29 DIAGNOSIS — R599 Enlarged lymph nodes, unspecified: Secondary | ICD-10-CM | POA: Insufficient documentation

## 2013-11-29 DIAGNOSIS — E042 Nontoxic multinodular goiter: Secondary | ICD-10-CM | POA: Insufficient documentation

## 2013-12-03 ENCOUNTER — Telehealth: Payer: Self-pay | Admitting: *Deleted

## 2013-12-03 ENCOUNTER — Other Ambulatory Visit: Payer: Self-pay | Admitting: Family Medicine

## 2013-12-03 DIAGNOSIS — E041 Nontoxic single thyroid nodule: Secondary | ICD-10-CM

## 2013-12-03 NOTE — Telephone Encounter (Signed)
Patient called back to see if we could send her referral to Dr Fredricka Bonine @ [facility]. She already has an apt set up for February 25th   CB#[phone] (Dr Fredricka Bonine ENT)

## 2013-12-20 NOTE — Telephone Encounter (Signed)
Spoke w/pt. She will go to [facility] and get records.

## 2013-12-23 ENCOUNTER — Other Ambulatory Visit: Payer: BC Managed Care – PPO

## 2013-12-24 ENCOUNTER — Other Ambulatory Visit: Payer: BC Managed Care – PPO

## 2014-02-21 ENCOUNTER — Encounter: Payer: Self-pay | Admitting: Family Medicine

## 2014-02-21 ENCOUNTER — Ambulatory Visit (INDEPENDENT_AMBULATORY_CARE_PROVIDER_SITE_OTHER): Payer: BC Managed Care – PPO | Admitting: Family Medicine

## 2014-02-21 VITALS — BP 112/80 | HR 75 | Temp 98.4°F | Resp 16 | Wt 155.4 lb

## 2014-02-21 DIAGNOSIS — H6980 Other specified disorders of Eustachian tube, unspecified ear: Secondary | ICD-10-CM

## 2014-02-21 DIAGNOSIS — L74519 Primary focal hyperhidrosis, unspecified: Secondary | ICD-10-CM

## 2014-02-21 DIAGNOSIS — R61 Generalized hyperhidrosis: Secondary | ICD-10-CM

## 2014-02-21 DIAGNOSIS — H6990 Unspecified Eustachian tube disorder, unspecified ear: Secondary | ICD-10-CM | POA: Insufficient documentation

## 2014-02-21 HISTORY — DX: Generalized hyperhidrosis: R61

## 2014-02-21 MED ORDER — GLYCOPYRROLATE 1 MG PO TABS: 1.0000 mg | ORAL_TABLET | Freq: Three times a day (TID) | ORAL | Status: DC

## 2014-02-21 MED ORDER — MOMETASONE FUROATE 50 MCG/ACT NA SUSP: 2.0000 | Freq: Every day | NASAL | Status: DC

## 2014-02-21 NOTE — Progress Notes (Signed)
   Subjective:    Patient ID: Sheryl Nichols, female    DOB: 01-04-1971, 43 y.o.   MRN: [id number]  HPI Hyperhidrosis- localized to R armpit.  Frequently staining clothes.  Has tried multiple OTC clinical strength deodorants.  Hearing loss- pt has excessive nasal congestion, hearing is muffled- 'sounds like everyone is mumbling'.  sxs started 'a couple of months ago' when she developed a sinus infection.  Taking sublingual drops for allergies and a benadryl before bed (sees Dr Regan Lemming, ENT)   Review of Systems For ROS see HPI     Objective:   Physical Exam  Vitals reviewed. Constitutional: She appears well-developed and well-nourished. No distress.  HENT:  Head: Normocephalic and atraumatic.  Right Ear: Tympanic membrane is retracted.  Left Ear: Tympanic membrane is retracted.  Nose: Mucosal edema and rhinorrhea present. Right sinus exhibits no maxillary sinus tenderness and no frontal sinus tenderness. Left sinus exhibits no maxillary sinus tenderness and no frontal sinus tenderness.  Mouth/Throat: Mucous membranes are normal. Posterior oropharyngeal erythema (w/ PND) present.  Eyes: Conjunctivae and EOM are normal. Pupils are equal, round, and reactive to light.  Neck: Normal range of motion. Neck supple.  Cardiovascular: Normal rate, regular rhythm and normal heart sounds.   Pulmonary/Chest: Effort normal and breath sounds normal. No respiratory distress. She has no wheezes. She has no rales.  Lymphadenopathy:    She has no cervical adenopathy.          Assessment & Plan:

## 2014-02-21 NOTE — Assessment & Plan Note (Signed)
New.  Discussed options- prescription strength deodorant vs oral medication.  Pt elects to start Glycopyrrolate.  Will follow for symptom improvement.

## 2014-02-21 NOTE — Patient Instructions (Signed)
Follow up as needed Start the Glycopyrrolate 2-3x/day.  This is the lowest dose and we can go up if needed If the medication is not effective after 2-4 weeks, please call and we can try the prescription deodorant Your muffled hearing is due to your nasal congestion/allergies.  Start the Nasonex- 2 sprays each nostril daily- to decrease congestion Drink plenty of fluids Call with any questions or concerns Hang in there!

## 2014-02-21 NOTE — Assessment & Plan Note (Signed)
New.  This is responsible for pt's muffled hearing.  Start daily nasal steroid.  If no improvement, recommended she follow up w/ ENT.  Pt expressed understanding and is in agreement w/ plan.

## 2014-02-21 NOTE — Progress Notes (Signed)
Pre visit review using our clinic review tool, if applicable. No additional management support is needed unless otherwise documented below in the visit note. 

## 2014-07-07 ENCOUNTER — Other Ambulatory Visit: Payer: Self-pay | Admitting: Family Medicine

## 2014-07-07 NOTE — Telephone Encounter (Signed)
Med filled.  

## 2014-08-02 IMAGING — MG MM DIAG BREAST W/IMPLANT BILATERAL
8 series · 8 of 8 positions shown · non-contrast
Comparison: Mammography 08/21/2012.

CLINICAL DATA: Palpable lump in the upper left breast which the
patient noticed approximately 2 months ago. She is unable to
localize the lump today. Annual evaluation, right breast.

EXAM:
DIGITAL DIAGNOSTIC  BILATERAL MAMMOGRAM WITH IMPLANTS WITH CAD
ULTRASOUND LEFT BREAST

[R CC]
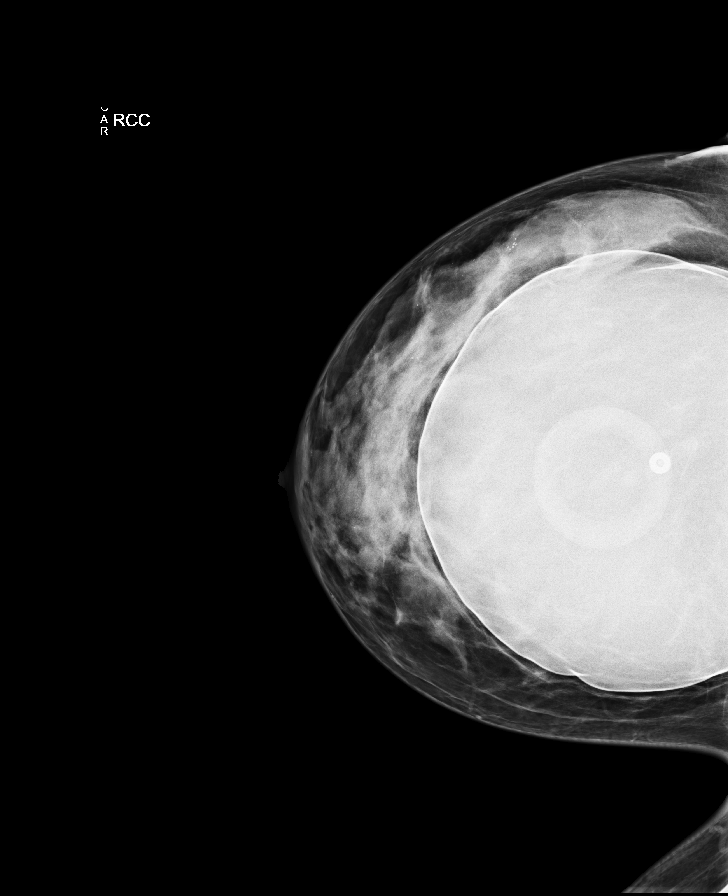

[L CC]
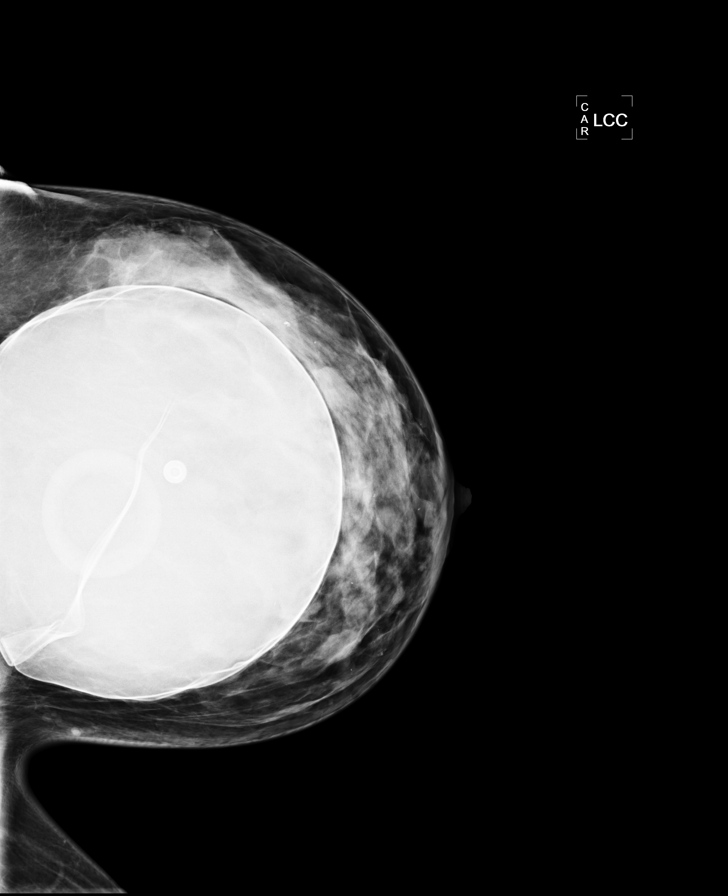

[L MLO]
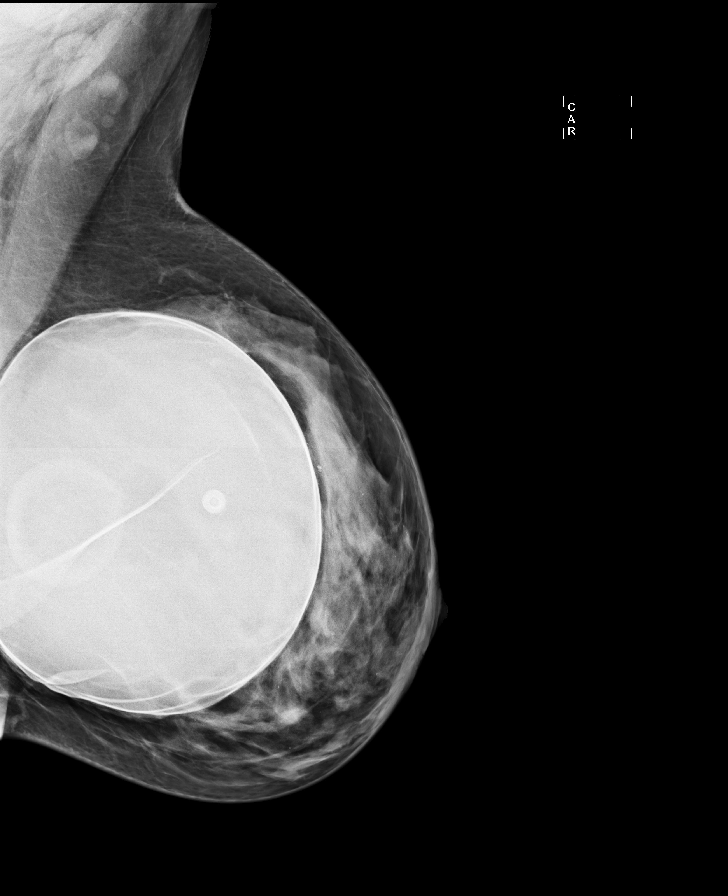

[R MLO]
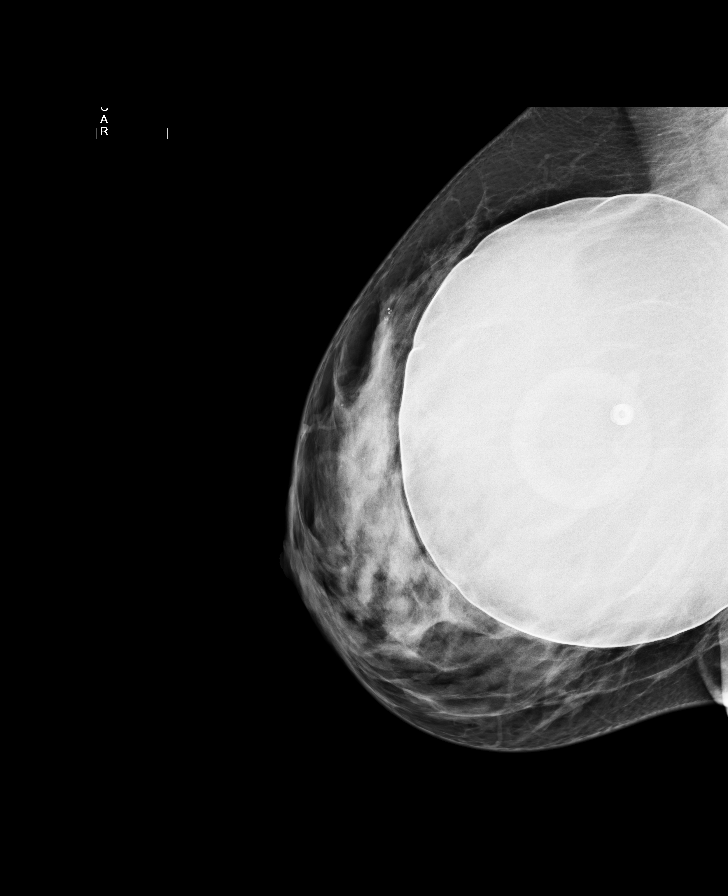

[L CCID]
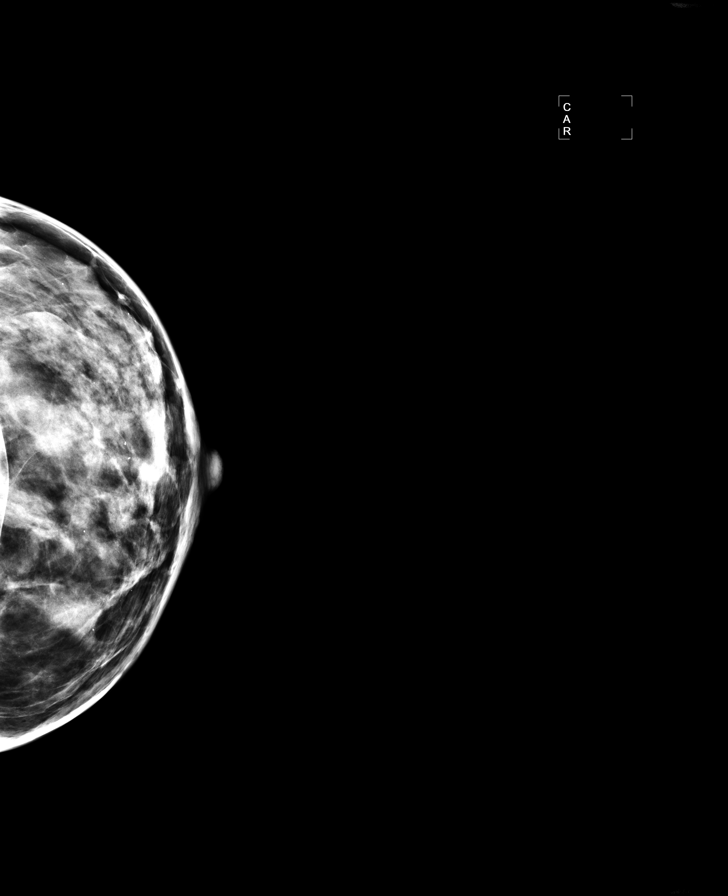

[L MLOID]
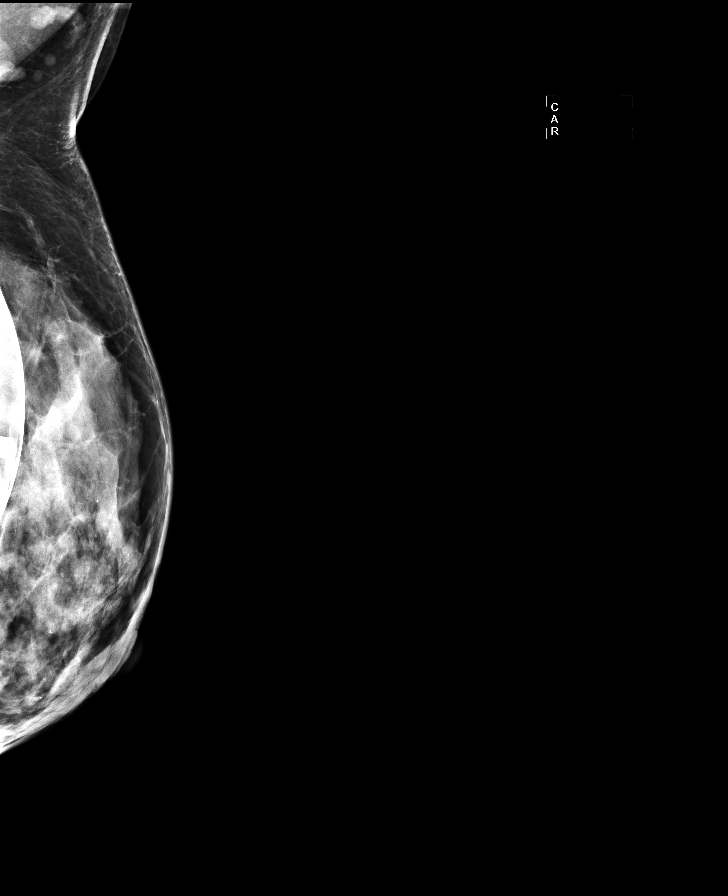

[R CCID]
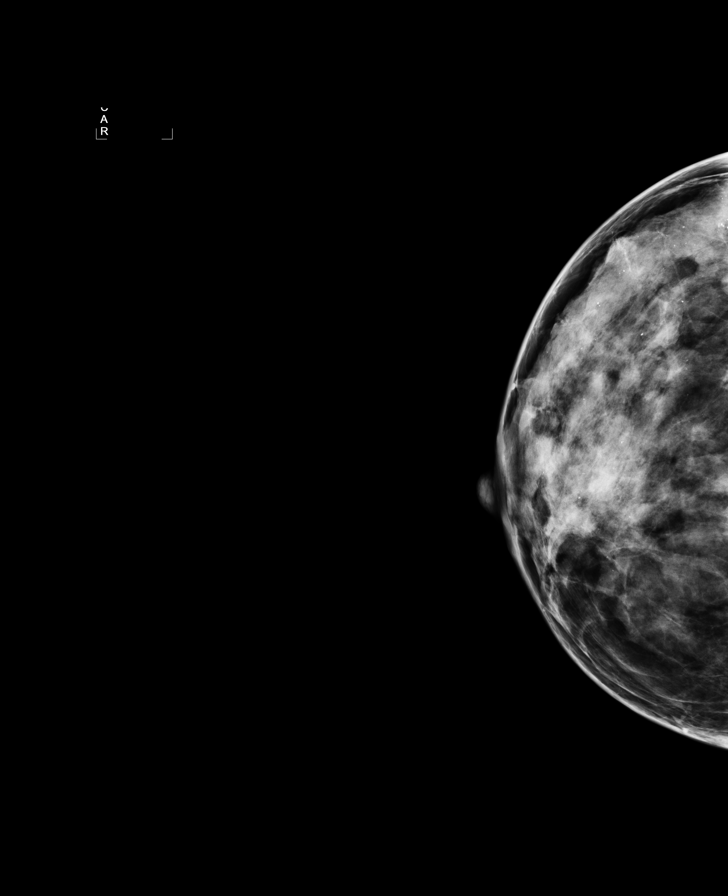

[R MLOID]
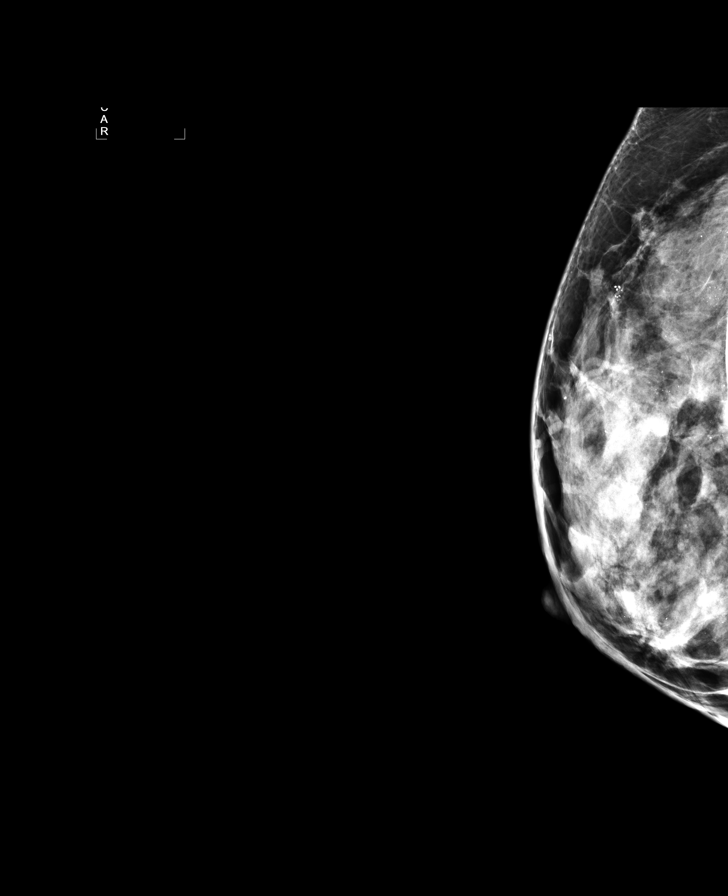

[8 of 8 positions shown; findings below may reference images not displayed]

No prior ultrasound.

ACR Breast Density Category d: The breast tissue is extremely dense,
which lowers the sensitivity of mammography.
FINDINGS: The patient has bilateral prepectoral saline implants. Standard and
implant displaced CC and MLO views of both breasts were obtained. No
findings suspicious for malignancy in either breast. Specifically,
no mammographic abnormality in the upper left breast in the area of
palpable concern.

Mammographic images were processed with CAD.

On physical exam, there is no discrete palpable mass in the upper
left breast in the area of the patient's concern.

Ultrasound is performed, showing normal fibroglandular tissue in the
area of palpable concern at the approximate 12 o'clock position of
the left breast 5-6 cm from the nipple. There is a horizontally
oriented oval-shaped circumscribed nearly anechoic mass with
acoustic enhancement at the 1 o'clock position approximately 3 cm
from the nipple measuring 0.2 x 0.5 x 0.4 cm. No suspicious solid
mass or abnormal acoustic shadowing was identified.
IMPRESSION: 1. No mammographic or sonographic evidence of malignancy, left
breast.
2. No mammographic evidence of malignancy, right breast.

RECOMMENDATION:
Screening mammogram in one year.(Code:15-P-LC1)

The importance of self breast examination was discussed with the
patient. I have discussed the findings and recommendations with the
patient. Results were also provided in writing at the conclusion of
the visit.

BI-RADS CATEGORY  2: Benign finding(s).

## 2014-08-10 IMAGING — US US SOFT TISSUE HEAD/NECK
1 series · 14 of 25 positions shown · non-contrast
Comparison: None.

CLINICAL DATA: Thyromegaly

EXAM:
THYROID ULTRASOUND
TECHNIQUE: Ultrasound examination of the thyroid gland and adjacent soft
tissues was performed.

[Series 1: us soft tissue head/neck · 0.07mm/px · 14 of 27 slices shown]
[im 1/27]
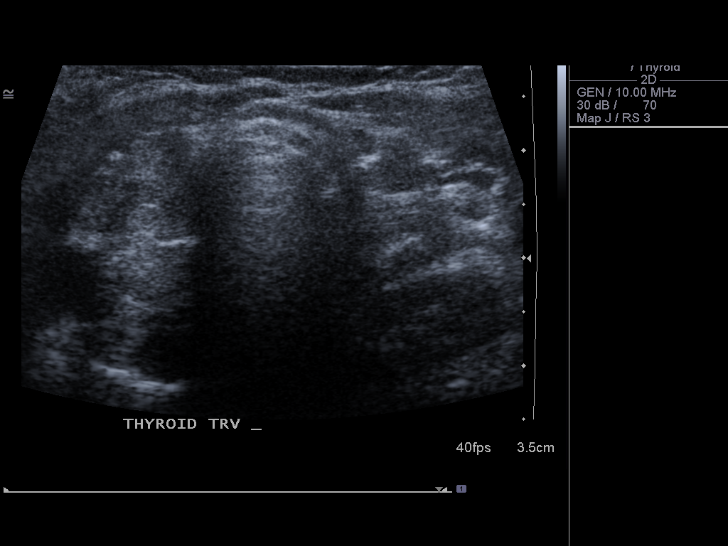
[im 3/27]
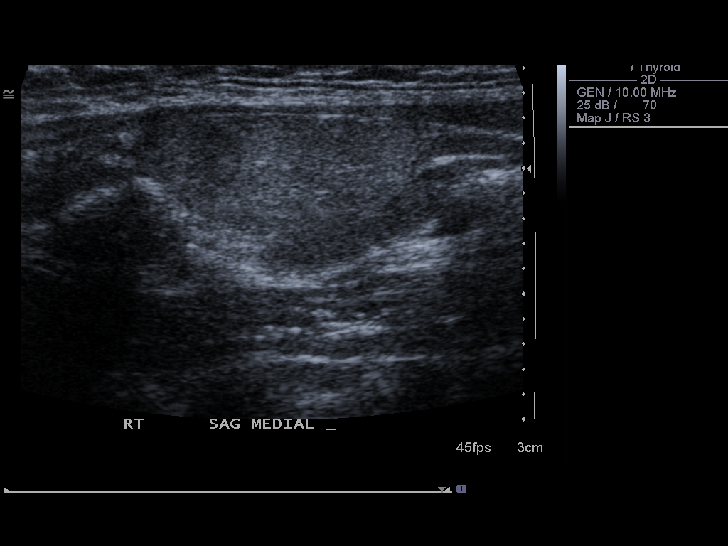
[im 5/27]
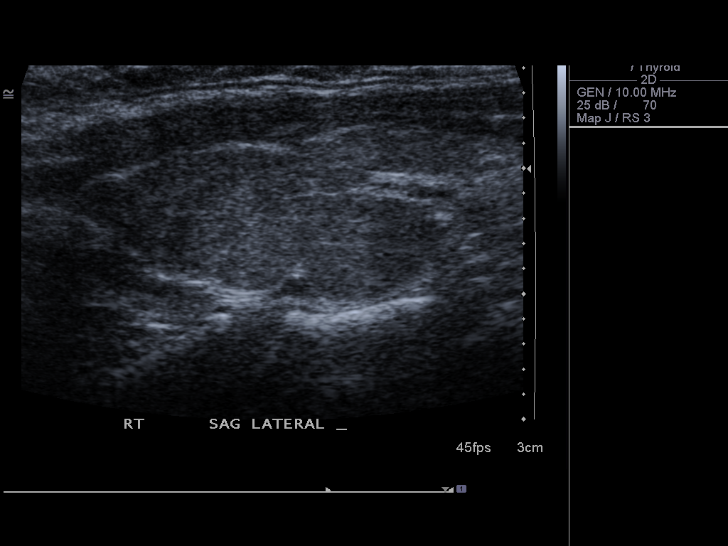
[im 7/27]
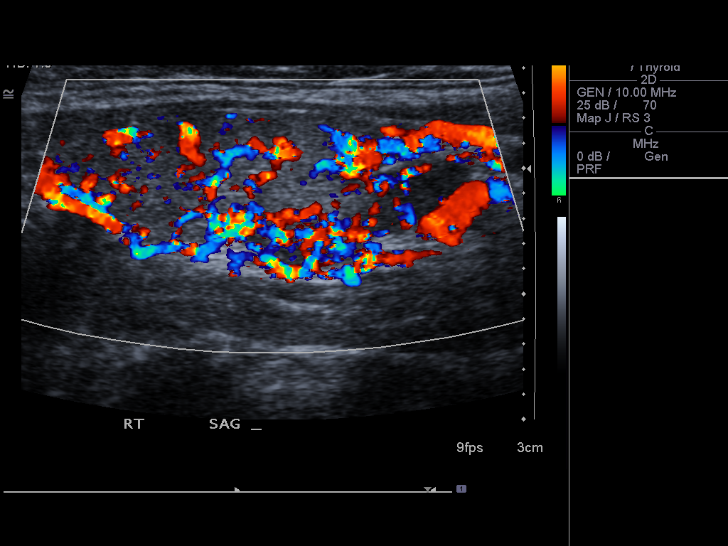
[im 9/27]
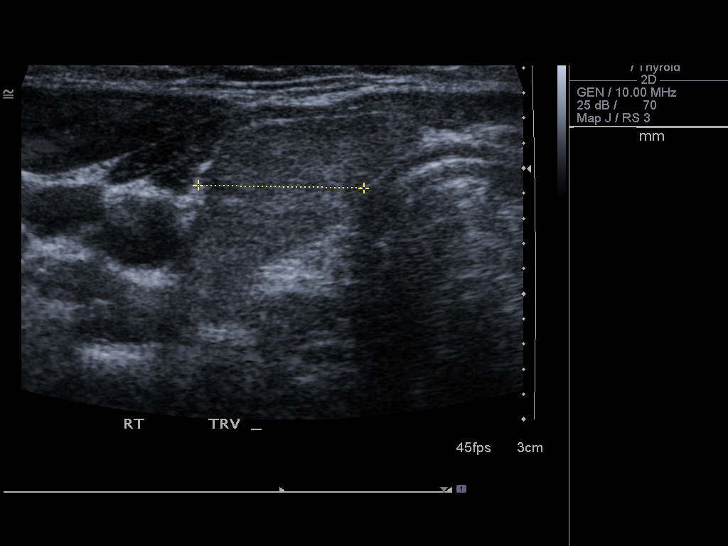
[im 10/27]
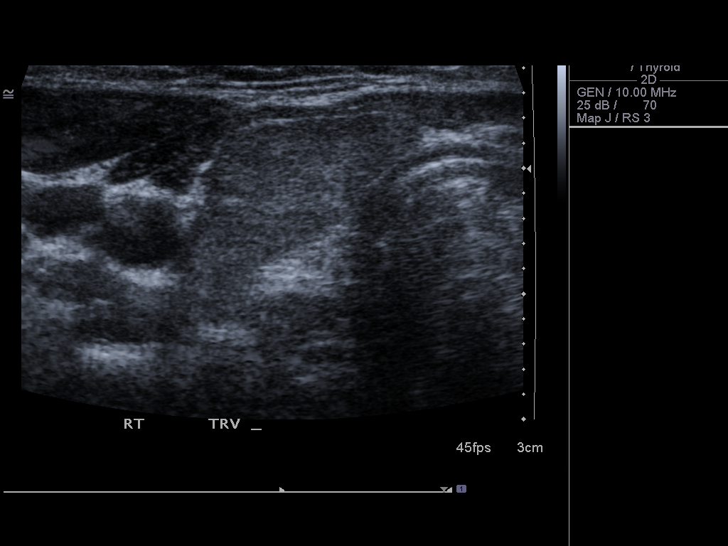
[im 12/27]
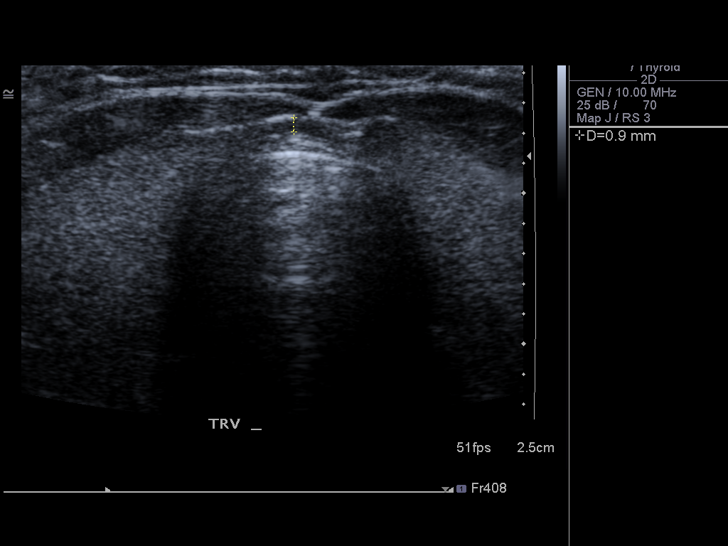
[im 15/27]
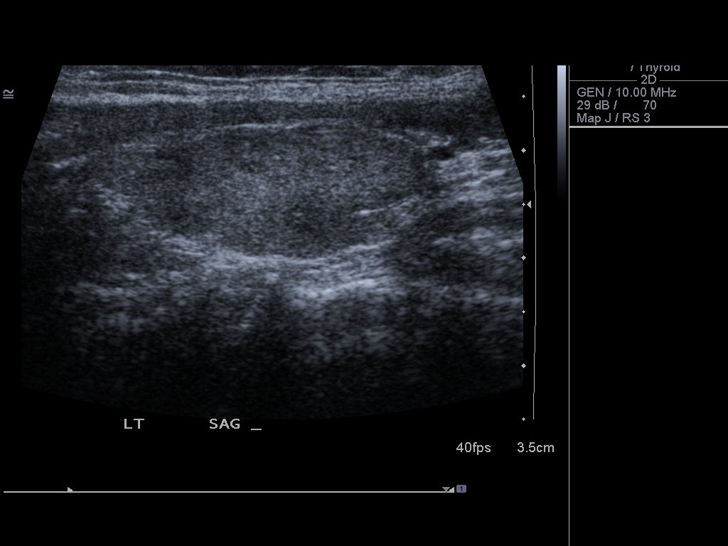
[im 17/27]
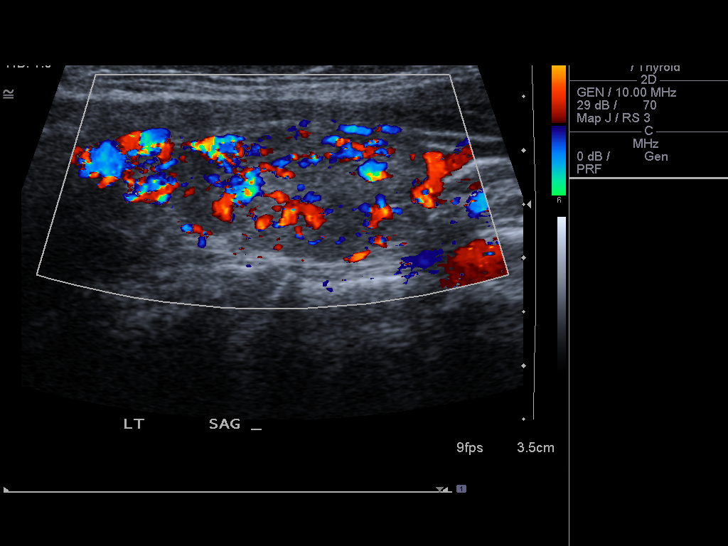
[im 18/27]
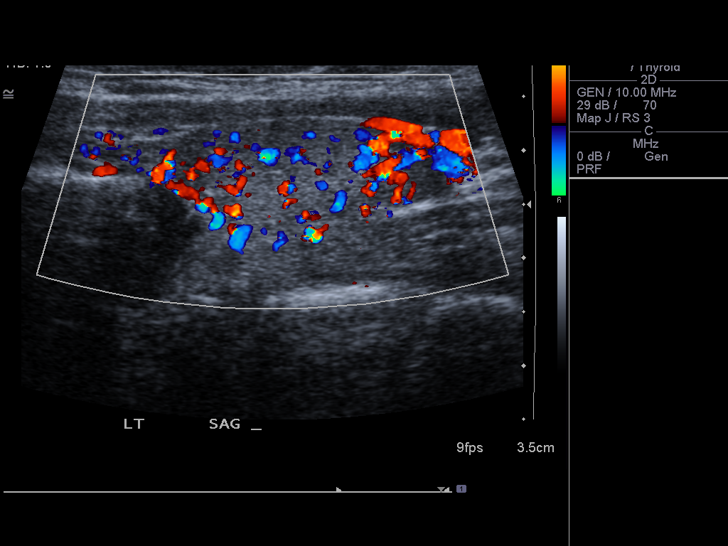
[im 20/27]
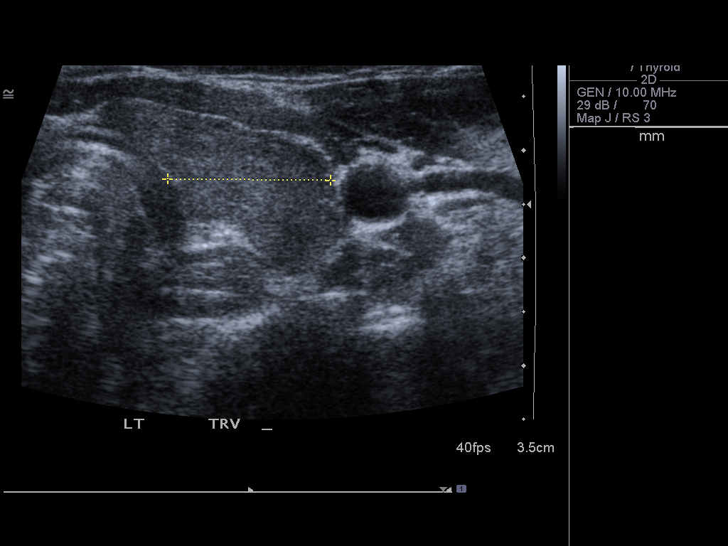
[im 22/27]
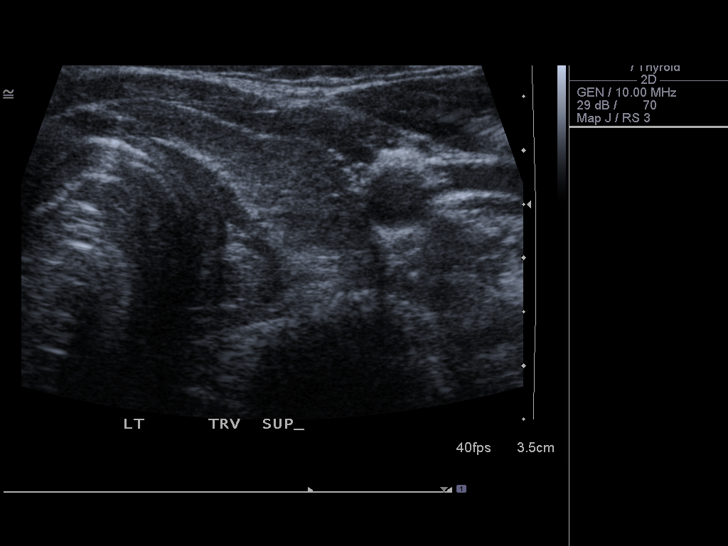
[im 24/27]
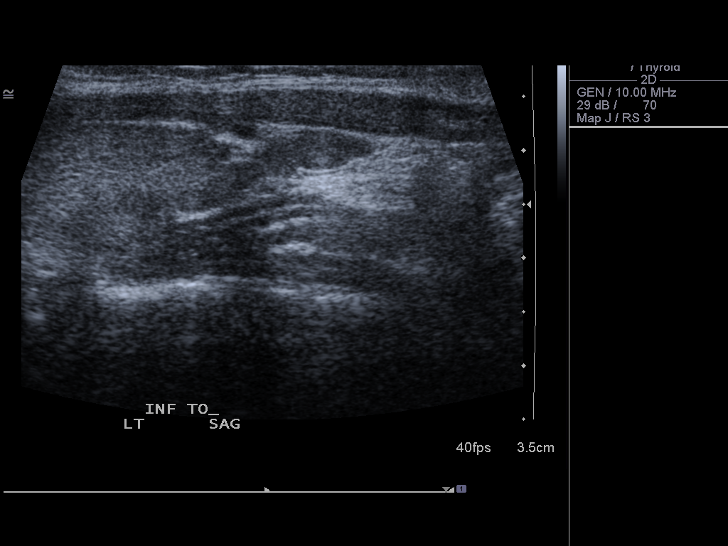
[im 27/27]
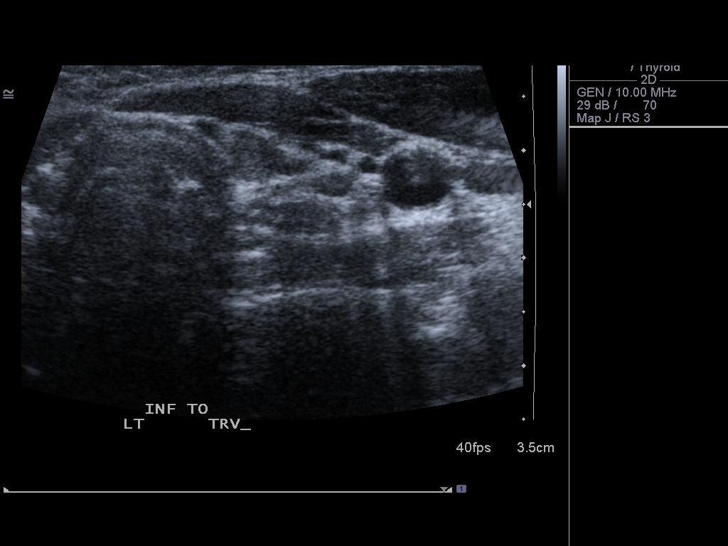

[14 of 25 positions shown; findings below may reference images not displayed]

FINDINGS: Right thyroid lobe

Measurements: 34 x 16 x 13 mm. Heterogeneous mildly hyperemic
parenchyma without focal nodule.

Left thyroid lobe

Measurements: 32 x 13 x 15 mm. There is an 11 x 7 x 4 mm slightly
hypoechoic nodule just inferior to the lobe, possibly parathyroid or
adenoma.

Isthmus

Thickness: 1 mm.  No nodules visualized.

Lymphadenopathy

None visualized.
IMPRESSION: 1. Normal thyroid.
2. 11 mm nodule inferior to the left lobe, possibly
parathyroid/adenoma.

## 2014-09-02 ENCOUNTER — Other Ambulatory Visit: Payer: Self-pay | Admitting: Family Medicine

## 2014-09-02 NOTE — Telephone Encounter (Signed)
Last OV 02-21-14 Medication filled 02-21-14 #90 with 3

## 2014-09-02 NOTE — Telephone Encounter (Signed)
Med filled.  

## 2014-11-07 ENCOUNTER — Other Ambulatory Visit: Payer: Self-pay | Admitting: Family Medicine

## 2014-11-07 NOTE — Telephone Encounter (Signed)
Med denied, pt has not had a thyroid level in 4 years.

## 2015-06-11 ENCOUNTER — Other Ambulatory Visit: Payer: Self-pay | Admitting: Family Medicine

## 2015-06-11 NOTE — Telephone Encounter (Signed)
Medication filled to pharmacy as requested.   

## 2015-11-25 ENCOUNTER — Encounter: Payer: Self-pay | Admitting: Physician Assistant

## 2015-11-25 ENCOUNTER — Ambulatory Visit (INDEPENDENT_AMBULATORY_CARE_PROVIDER_SITE_OTHER): Payer: BLUE CROSS/BLUE SHIELD | Admitting: Physician Assistant

## 2015-11-25 ENCOUNTER — Ambulatory Visit (HOSPITAL_BASED_OUTPATIENT_CLINIC_OR_DEPARTMENT_OTHER)
Admission: RE | Admit: 2015-11-25 | Discharge: 2015-11-25 | Disposition: A | Discharge status: Home / Self Care | Payer: BLUE CROSS/BLUE SHIELD | Source: Ambulatory Visit | Attending: Physician Assistant | Admitting: Physician Assistant

## 2015-11-25 VITALS — BP 129/73 | HR 85 | Temp 98.2°F | Ht 64.5 in | Wt 164.6 lb

## 2015-11-25 DIAGNOSIS — R509 Fever, unspecified: Secondary | ICD-10-CM | POA: Diagnosis not present

## 2015-11-25 DIAGNOSIS — J4531 Mild persistent asthma with (acute) exacerbation: Secondary | ICD-10-CM

## 2015-11-25 DIAGNOSIS — J45901 Unspecified asthma with (acute) exacerbation: Secondary | ICD-10-CM | POA: Insufficient documentation

## 2015-11-25 DIAGNOSIS — R0602 Shortness of breath: Secondary | ICD-10-CM | POA: Diagnosis present

## 2015-11-25 DIAGNOSIS — J069 Acute upper respiratory infection, unspecified: Secondary | ICD-10-CM

## 2015-11-25 MED ORDER — PREDNISONE 20 MG PO TABS: 40.0000 mg | ORAL_TABLET | Freq: Every day | ORAL | Status: DC

## 2015-11-25 MED ORDER — BECLOMETHASONE DIPROPIONATE 80 MCG/ACT IN AERS
2.0000 | INHALATION_SPRAY | Freq: Two times a day (BID) | RESPIRATORY_TRACT | Status: DC
Start: 2015-11-25 — End: 2018-01-29

## 2015-11-25 NOTE — Assessment & Plan Note (Signed)
With viral URI. 1.5 days of symptoms. Will cut down on albuterol to use only as directed as she has used 7 times today so far. Will add on Qvar and burst of prednisone. Supportive measures reviewed. Will obtain CXR. Follow-up discussed with patient. ER if anything worsens.

## 2015-11-25 NOTE — Progress Notes (Signed)
Pre visit review using our clinic review tool, if applicable. No additional management support is needed unless otherwise documented below in the visit note. 

## 2015-11-25 NOTE — Progress Notes (Signed)
Patient with history of asthma presents to clinic today c/o 1 day of SOB and wheezing associated with chest tightness. Has slightly worsened since yesterday. Endorses a low-grade fever at 99.9 this morning. Notes now with runny nose and R ear pain. Also endorses productive cough x 1 day. Has noted significant improvement in use of albuterol inhaler.   Past Medical History  Diagnosis Date  . Hypothyroidism     Never treated with medications  . Asthma   . Migraines     resolved off sugar    Current Outpatient Prescriptions on File Prior to Visit  Medication Sig Dispense Refill  . albuterol (PROVENTIL HFA;VENTOLIN HFA) 108 (90 BASE) MCG/ACT inhaler Inhale 2 puffs into the lungs. 1-2 puffs q 4-6 hrs prn    . budesonide (PULMICORT) 0.5 MG/2ML nebulizer solution     . DiphenhydrAMINE HCl (ALLERGY MED PO) Take by mouth. Sublingual drops daily    . levothyroxine (SYNTHROID, LEVOTHROID) 100 MCG tablet TAKE 1 TABLET BY MOUTH DAILY BEFORE BREAKFAST 30 tablet 3  . valACYclovir (VALTREX) 500 MG tablet Reported on 11/25/2015     No current facility-administered medications on file prior to visit.    Allergies  Allergen Reactions  . Codeine     REACTION: tingling all over; / nausea & vomiting    Family History  Problem Relation Age of Onset  . Adopted: Yes  . Hyperlipidemia Mother   . Hypertension Mother   . Heart attack Mother 60  . Hypothyroidism Mother   . Cancer Maternal Grandfather     Brain Cancer  . Diabetes Neg Hx   . Stroke Neg Hx     Social History   Social History  . Marital Status: Married    Spouse Name: N/A  . Number of Children: N/A  . Years of Education: N/A   Social History Main Topics  . Smoking status: Never Smoker   . Smokeless tobacco: None  . Alcohol Use: 4.2 oz/week    7 Glasses of wine per week  . Drug Use: No  . Sexual Activity: Not Asked   Other Topics Concern  . None   Social History Narrative   Born in [place], moved to [place] as a teen   Married-husband is Chief Financial Officer, 4 children   Review of Systems - See HPI.  All other ROS are negative.  BP 129/73 mmHg  Pulse 85  Temp(Src) 98.2 F (36.8 C) (Oral)  Ht 5' 4.5" (1.638 m)  Wt 164 lb 9.6 oz (74.662 kg)  BMI 27.83 kg/m2  SpO2 99%  LMP 11/12/2015  Physical Exam  Constitutional: She is oriented to person, place, and time and well-developed, well-nourished, and in no distress.  HENT:  Head: Normocephalic and atraumatic.  Right Ear: External ear normal.  Left Ear: External ear normal.  Nose: Nose normal.  Mouth/Throat: Oropharynx is clear and moist. No oropharyngeal exudate.  Clear fluid noted behind R TM without evidence of AOM  Eyes: Conjunctivae are normal.  Neck: Neck supple.  Cardiovascular: Normal rate, regular rhythm, normal heart sounds and intact distal pulses.   Pulmonary/Chest: No respiratory distress. She has wheezes. She has no rales. She exhibits no tenderness.  Neurological: She is alert and oriented to person, place, and time.  Skin: Skin is warm and dry. No rash noted.  Psychiatric: Affect normal.  Vitals reviewed.  No results found for this or any previous visit (from the past 2160 hour(s)).  Assessment/Plan: Asthma with acute exacerbation With viral URI. 1.5  days of symptoms. Will cut down on albuterol to use only as directed as she has used 7 times today so far. Will add on Qvar and burst of prednisone. Supportive measures reviewed. Will obtain CXR. Follow-up discussed with patient. ER if anything worsens.

## 2015-11-25 NOTE — Patient Instructions (Signed)
Please go downstairs for a chest x-ray. Increase fluids and get plenty of rest.  Continue Albuterol, taking no more than directed.  Take the Qvar and prednisone as directed. Tylenol for fever. Get some OTC Flonase to use as directed.  We will call with your x-ray results. I will call tonight if I see anything worrisome. Otherwise you will get a call in the morning.  Follow-up in office if symptoms are not significantly improving in the next 24-48 hours. If anything acutely worsens please go to the ER.

## 2016-08-05 IMAGING — DX DG CHEST 2V
2 series · 2 of 2 positions shown · non-contrast
Comparison: None.

CLINICAL DATA: Difficulty breathing.  Fever.

EXAM:
CHEST  2 VIEW

[chest pa]
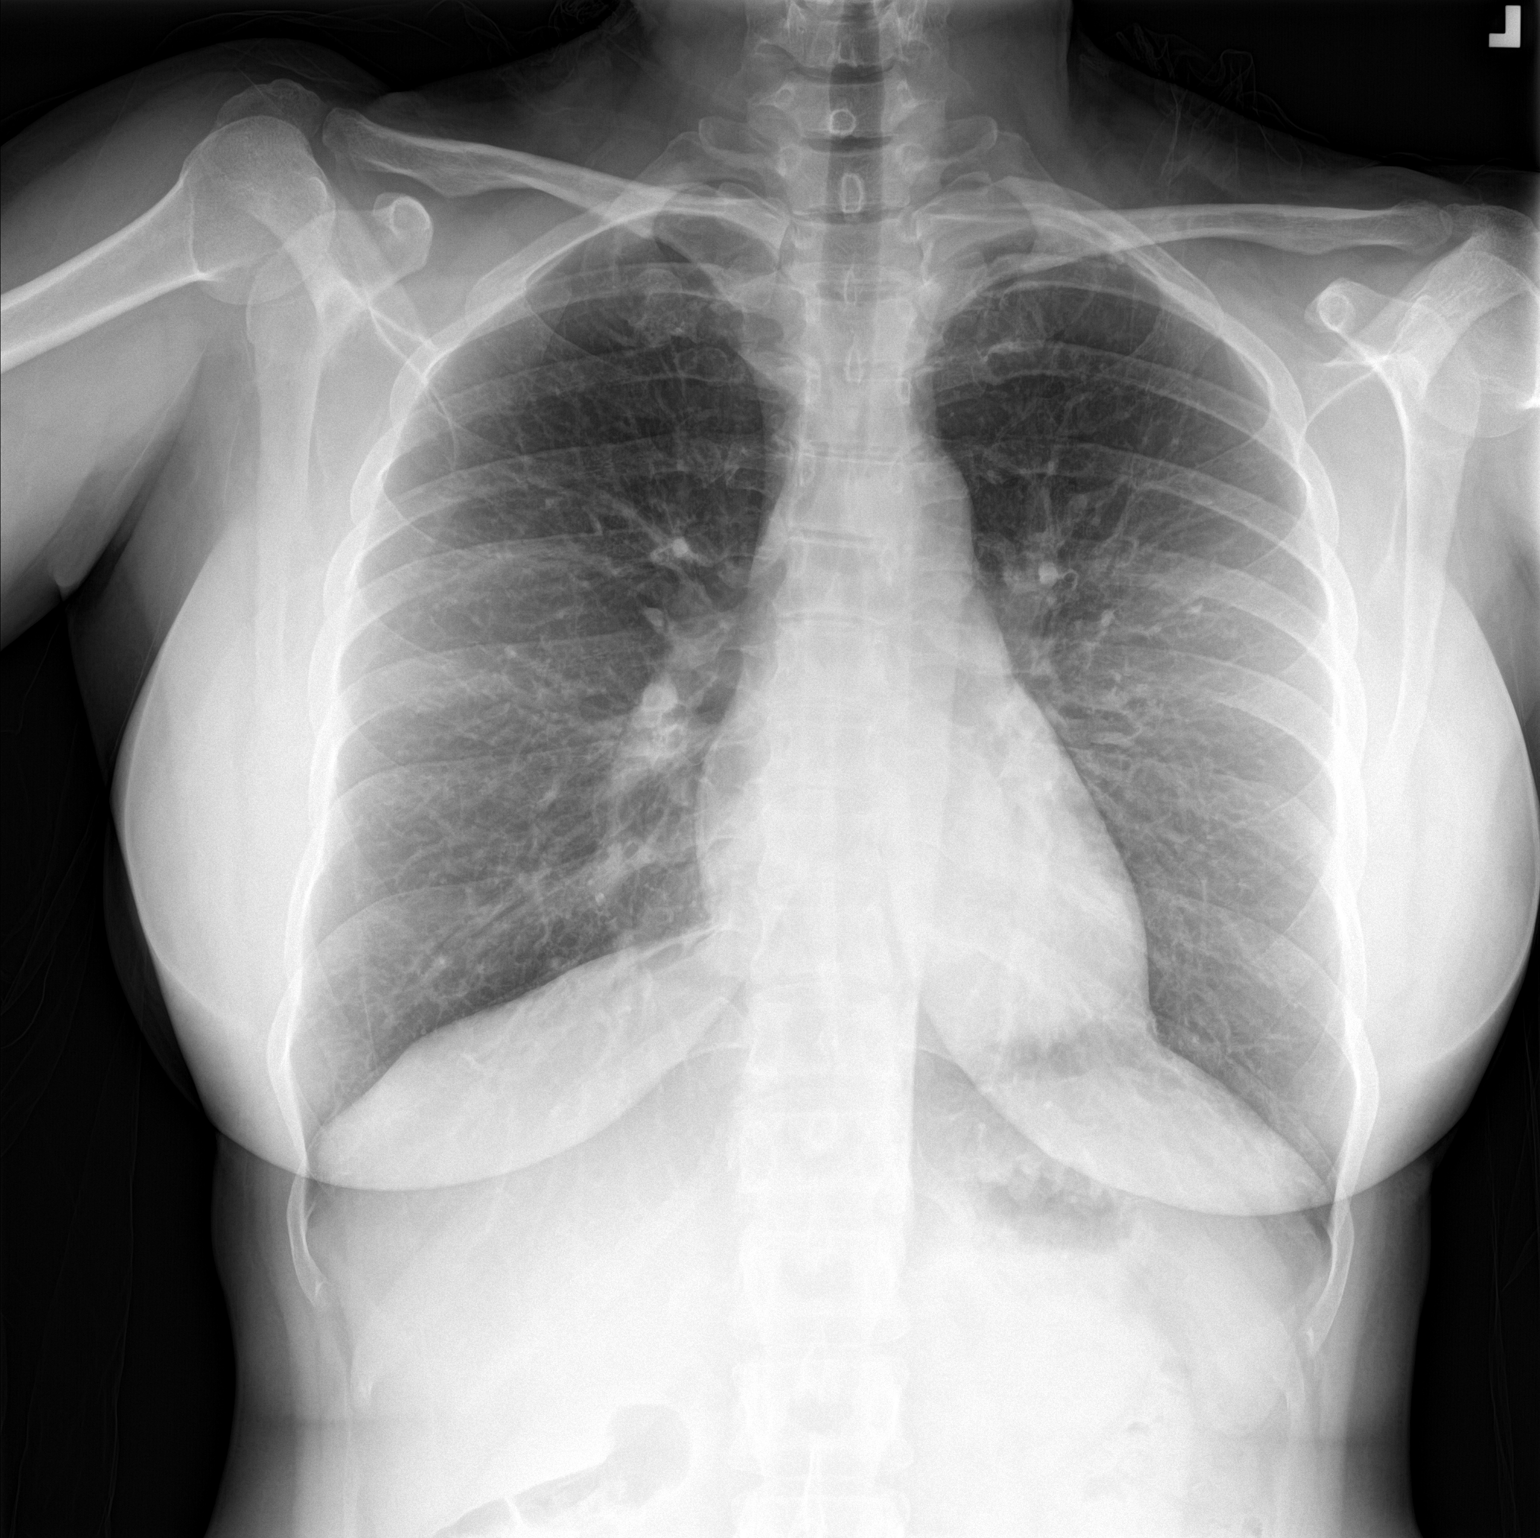

[chest lat]
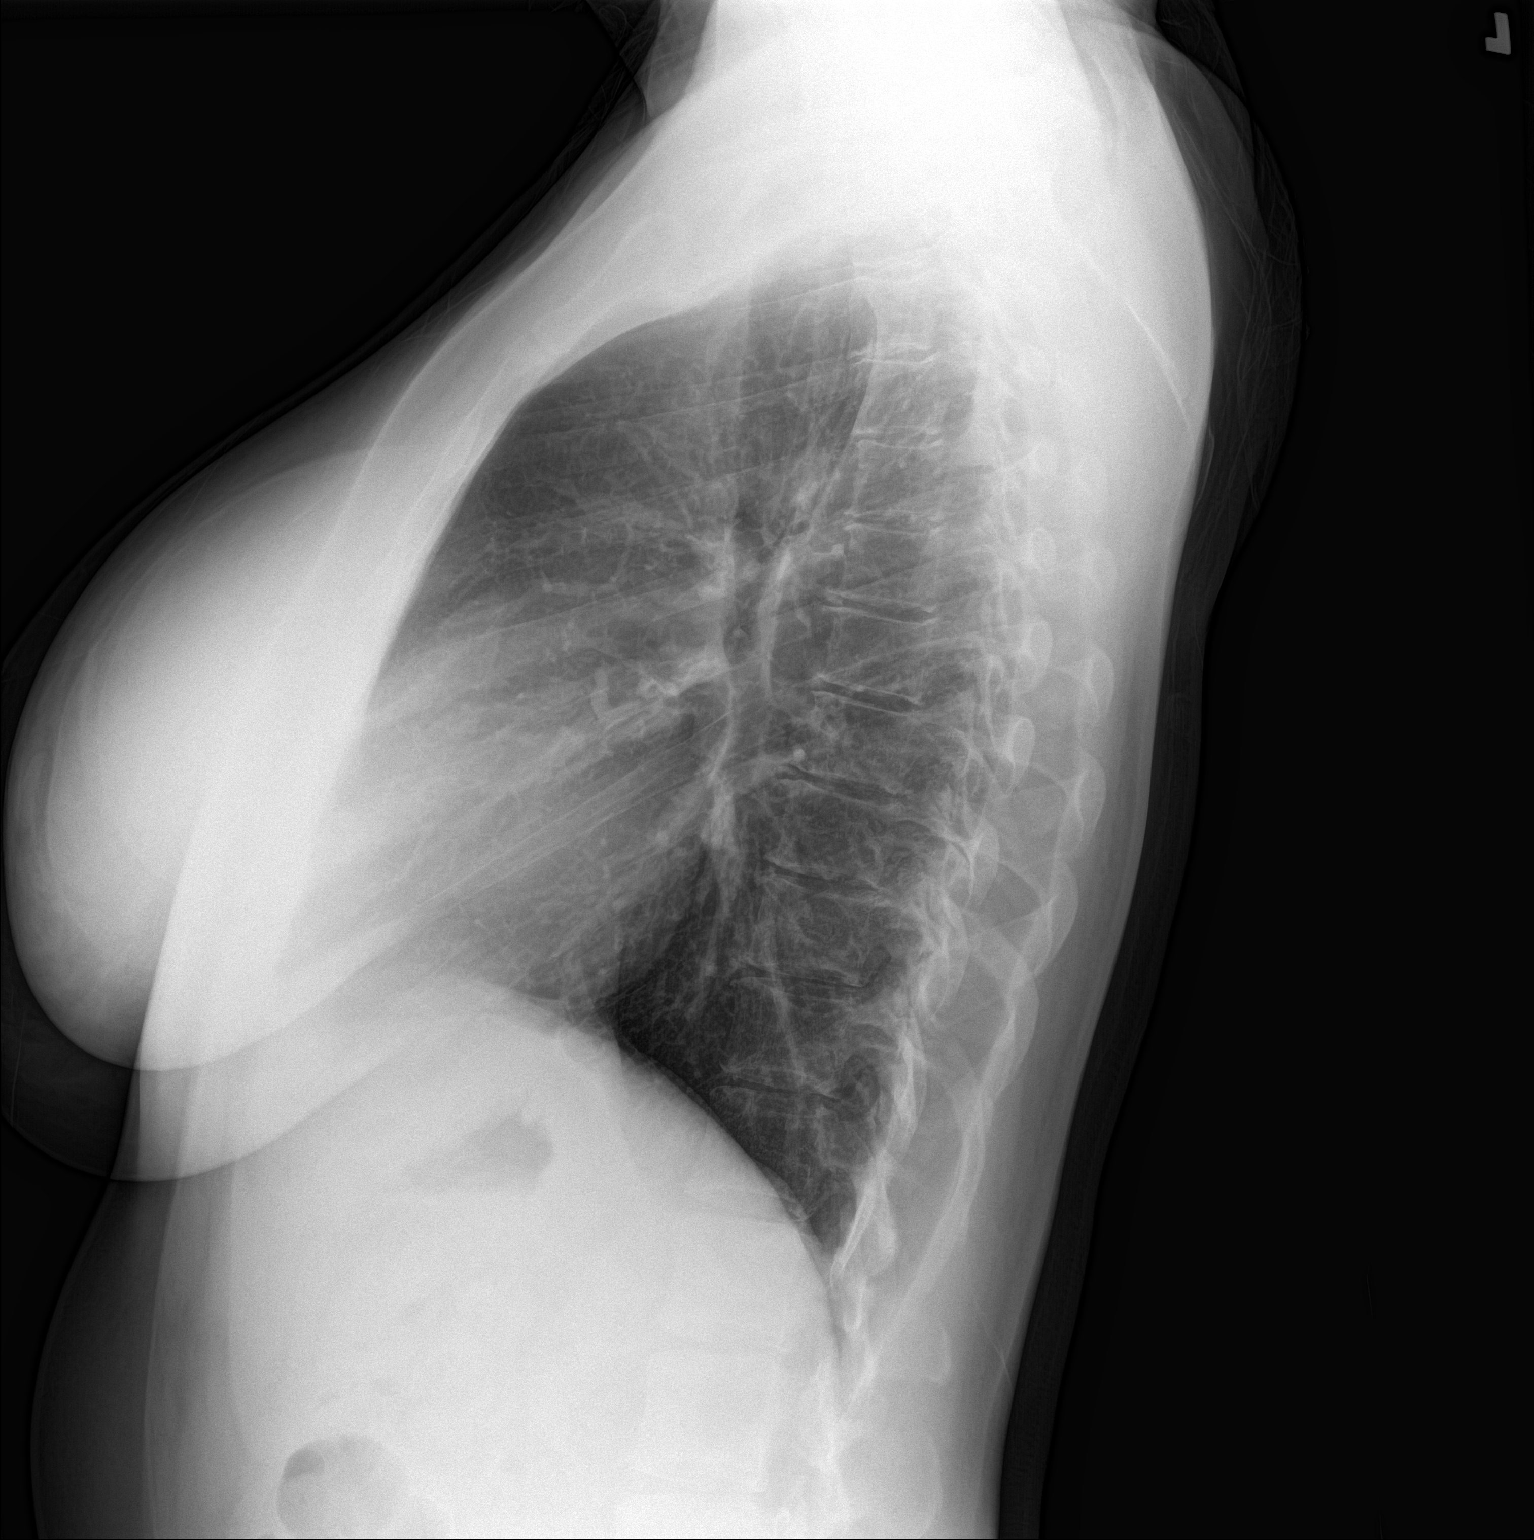

[2 of 2 positions shown; findings below may reference images not displayed]

FINDINGS: The heart size and mediastinal contours are within normal limits.
Both lungs are clear. The visualized skeletal structures are
unremarkable.
IMPRESSION: No active cardiopulmonary disease.

## 2017-06-08 LAB — HM PAP SMEAR

## 2017-08-29 ENCOUNTER — Telehealth: Payer: Self-pay | Admitting: *Deleted

## 2017-08-29 NOTE — Telephone Encounter (Signed)
Patient called in questioning if there is a way to do any testing for poisoning.  She states that from 2013-Aug 2018 she has had multiple issues with hair thinning, itching, thyroid issues, weight... She states that she has seen multiple doctors through the years.  She states that she found out her husband had been having a 85 year affair, and they separated May 25, 2017.   She is questioning if there could have been poisoning going on that was making her sick because since the separation she feels so much better.  She states that her thyroid is now normal, hair is growing back, she's losing weight.   She is asking if there is a hair follicle test or blood test that could be done to see if there is anything abnormal.  I consulted with Dr. Birdie Riddle, and she states that we do not do hair follicle testing here.  For poisoning purposes, patient would have to contact poison control or the police and find out where outside labs are that do this testing.  After this time frame, blood testing would no longer be valid, therefore we cannot do testing.   I have called patient back and spoke with her about this.  She states verbal understanding and states that she will contact the family victims unit for testing.  She also requested that we remove him from the [facility] so that no information is released.     I have made note that we are not to release any PHI to anyone other than patient herself.

## 2018-01-21 ENCOUNTER — Other Ambulatory Visit: Payer: Self-pay

## 2018-01-21 ENCOUNTER — Ambulatory Visit (HOSPITAL_COMMUNITY)
Admission: EM | Admit: 2018-01-21 | Discharge: 2018-01-21 | Disposition: A | Discharge status: Home / Self Care | Payer: BLUE CROSS/BLUE SHIELD | Attending: Physician Assistant | Admitting: Physician Assistant

## 2018-01-21 DIAGNOSIS — W57XXXA Bitten or stung by nonvenomous insect and other nonvenomous arthropods, initial encounter: Secondary | ICD-10-CM

## 2018-01-21 DIAGNOSIS — S80861A Insect bite (nonvenomous), right lower leg, initial encounter: Secondary | ICD-10-CM | POA: Diagnosis not present

## 2018-01-21 MED ORDER — DOXYCYCLINE HYCLATE 100 MG PO CAPS: 100.0000 mg | ORAL_CAPSULE | Freq: Two times a day (BID) | ORAL | 0 refills | Status: AC

## 2018-01-21 MED ORDER — TRIAMCINOLONE ACETONIDE 0.1 % EX CREA: 1.0000 "application " | TOPICAL_CREAM | Freq: Two times a day (BID) | CUTANEOUS | 0 refills | Status: DC

## 2018-01-21 NOTE — ED Triage Notes (Signed)
Per pt she thinks her dog brought in a tick and she was able to take one off yesterday and took one off this morning but spot is itchy and swollen

## 2018-01-21 NOTE — ED Provider Notes (Signed)
[facility]    CSN: [id number] Arrival date & time: 01/21/18  1933     History   Chief Complaint No chief complaint on file.   HPI Sheryl Nichols is a 47 y.o. female.   47 year old female comes in for tick bites to the right lower extremity.  States she removed 2 ticks on her right lower leg.  Takes were not engorged, thinks they may be on her for 24 hours.  Areas are itchy, with some erythema and swelling.  Denies fever, chills, night sweats.  States now having some pruritus throughout her body.  Has not taken anything for it. Patient would like prophylactic treatment for tick disease.      Past Medical History:  Diagnosis Date  . Asthma   . Hypothyroidism    Never treated with medications  . Migraines    resolved off sugar    Patient Active Problem List   Diagnosis Date Noted  . Asthma with acute exacerbation 11/25/2015  . Hyperhidrosis 02/21/2014  . Eustachian tube dysfunction 02/21/2014  . CTS (carpal tunnel syndrome) 06/04/2013  . PRURITUS 09/06/2010  . DERMATOGRAPHIA 09/06/2010  . RHINITIS 09/13/2009  . ASTHMA 08/22/2009  . SLEEP DISORDER, CHRONIC 08/21/2009  . RECTAL BLEEDING 10/01/2008  . HYPOTHYROIDISM 07/31/2007  . ANEMIA-NOS 07/31/2007  . CARDIAC MURMUR 07/31/2007    Past Surgical History:  Procedure Laterality Date  . BREAST ENHANCEMENT SURGERY    . COLONOSCOPY  2009   for rectal bleeding; IH found  . SEPTOPLASTY     sinus cystectomy  . TONSILLECTOMY    . TONSILLECTOMY AND ADENOIDECTOMY    . TYMPANOPLASTY      OB History   None      Home Medications    Prior to Admission medications   Medication Sig Start Date End Date Taking? Authorizing Provider  albuterol (PROVENTIL HFA;VENTOLIN HFA) 108 (90 BASE) MCG/ACT inhaler Inhale 2 puffs into the lungs. 1-2 puffs q 4-6 hrs prn    [provider]  beclomethasone (QVAR) 80 MCG/ACT inhaler Inhale 2 puffs into the lungs 2 (two) times daily. 11/25/15   Brunetta Jeans,  PA-C  budesonide (PULMICORT) 0.5 MG/2ML nebulizer solution  01/14/14   [provider]  DiphenhydrAMINE HCl (ALLERGY MED PO) Take by mouth. Sublingual drops daily    [provider]  doxycycline (VIBRAMYCIN) 100 MG capsule Take 1 capsule (100 mg total) by mouth 2 (two) times daily for 7 days. 01/21/18 01/28/18  Tasia Catchings, Christophe Rising V, PA-C  levothyroxine (SYNTHROID, LEVOTHROID) 100 MCG tablet TAKE 1 TABLET BY MOUTH DAILY BEFORE BREAKFAST 07/07/14   Midge Minium, MD  predniSONE (DELTASONE) 20 MG tablet Take 2 tablets (40 mg total) by mouth daily with breakfast. 11/25/15   Brunetta Jeans, PA-C  triamcinolone cream (KENALOG) 0.1 % Apply 1 application topically 2 (two) times daily. 01/21/18   Ok Edwards, PA-C  valACYclovir (VALTREX) 500 MG tablet Reported on 11/25/2015 08/28/13   [provider]    Family History Family History  Adopted: Yes  Problem Relation Age of Onset  . Hyperlipidemia Mother   . Hypertension Mother   . Heart attack Mother 46  . Hypothyroidism Mother   . Cancer Maternal Grandfather        Brain Cancer  . Diabetes Neg Hx   . Stroke Neg Hx     Social History Social History   Tobacco Use  . Smoking status: Never Smoker  Substance Use Topics  . Alcohol use: Yes  Alcohol/week: 4.2 oz    Types: 7 Glasses of wine per week  . Drug use: No     Allergies   Codeine   Review of Systems Review of Systems  Reason unable to perform ROS: See HPI as above.     Physical Exam Triage Vital Signs ED Triage Vitals  Enc Vitals Group     BP 01/21/18 2120 (!) 147/73     Pulse Rate 01/21/18 2120 64     Resp --      Temp 01/21/18 2120 98 F (36.7 C)     Temp Source 01/21/18 2120 Oral     SpO2 01/21/18 2120 100 %     Weight --      Height --      Head Circumference --      Peak Flow --      Pain Score 01/21/18 2115 (S) 0     Pain Loc --      Pain Edu? --      Excl. in [place]? --    No data found.  Updated Vital Signs BP (!) 147/73 (BP Location:  Left Arm)   Pulse 64   Temp 98 F (36.7 C) (Oral)   SpO2 100%   Physical Exam  Constitutional: She is oriented to person, place, and time. She appears well-developed and well-nourished. No distress.  HENT:  Head: Normocephalic and atraumatic.  Eyes: Pupils are equal, round, and reactive to light. Conjunctivae are normal.  Neurological: She is alert and oriented to person, place, and time.  Skin: Skin is warm and dry.  2 small wounds on right lower calf. Surrounding swelling, erythema with mild increased warmth. No tenderness on palpation.      UC Treatments / Results  Labs (all labs ordered are listed, but only abnormal results are displayed) Labs Reviewed - No data to display  EKG None Radiology No results found.  Procedures Procedures (including critical care time)  Medications Ordered in UC Medications - No data to display   Initial Impression / Assessment and Plan / UC Course  I have reviewed the triage vital signs and the nursing notes.  Pertinent labs & imaging results that were available during my care of the patient were reviewed by me and considered in my medical decision making (see chart for details).    Patient would like to be covered for tick borne disease. Long discussion with patient that low worries for tick borne disease without engorged tick, patient was insistent on treatment. Start doxycycline as directed. Other symptomatic treatment discussed. Return precautions given. Patient expresses understanding and agrees to plan.   Final Clinical Impressions(s) / UC Diagnoses   Final diagnoses:  Tick bite, initial encounter    ED Discharge Orders        Ordered    doxycycline (VIBRAMYCIN) 100 MG capsule  2 times daily     01/21/18 2132    triamcinolone cream (KENALOG) 0.1 %  2 times daily     01/21/18 2132        Ok Edwards, PA-C 01/23/18 1020

## 2018-01-21 NOTE — Discharge Instructions (Signed)
Start doxycycline as directed. Zyrtec/benadryl for itching. Triamcinolone cream on affected area for inflammation/itching. Monitor for any spreading redness, increased warmth, fever, follow up for reevaluation.

## 2018-01-29 ENCOUNTER — Ambulatory Visit (INDEPENDENT_AMBULATORY_CARE_PROVIDER_SITE_OTHER): Payer: BLUE CROSS/BLUE SHIELD | Admitting: Family Medicine

## 2018-01-29 ENCOUNTER — Encounter: Payer: Self-pay | Admitting: Family Medicine

## 2018-01-29 ENCOUNTER — Other Ambulatory Visit: Payer: Self-pay

## 2018-01-29 VITALS — BP 122/80 | HR 88 | Temp 97.9°F | Resp 16 | Ht 65.0 in | Wt 148.4 lb

## 2018-01-29 DIAGNOSIS — S80861D Insect bite (nonvenomous), right lower leg, subsequent encounter: Secondary | ICD-10-CM | POA: Diagnosis not present

## 2018-01-29 DIAGNOSIS — W57XXXD Bitten or stung by nonvenomous insect and other nonvenomous arthropods, subsequent encounter: Secondary | ICD-10-CM | POA: Diagnosis not present

## 2018-01-29 DIAGNOSIS — J4531 Mild persistent asthma with (acute) exacerbation: Secondary | ICD-10-CM

## 2018-01-29 DIAGNOSIS — L259 Unspecified contact dermatitis, unspecified cause: Secondary | ICD-10-CM

## 2018-01-29 MED ORDER — DOXYCYCLINE HYCLATE 100 MG PO TABS: 100.0000 mg | ORAL_TABLET | Freq: Two times a day (BID) | ORAL | 0 refills | Status: DC

## 2018-01-29 MED ORDER — BECLOMETHASONE DIPROPIONATE 80 MCG/ACT IN AERS: 2.0000 | INHALATION_SPRAY | Freq: Two times a day (BID) | RESPIRATORY_TRACT | 6 refills | Status: DC

## 2018-01-29 MED ORDER — ALBUTEROL SULFATE HFA 108 (90 BASE) MCG/ACT IN AERS: 2.0000 | INHALATION_SPRAY | Freq: Four times a day (QID) | RESPIRATORY_TRACT | 6 refills | Status: DC | PRN

## 2018-01-29 MED ORDER — PREDNISONE 10 MG PO TABS: ORAL_TABLET | ORAL | 0 refills | Status: DC

## 2018-01-29 NOTE — Patient Instructions (Signed)
Please schedule your physical in 6 months START the Prednisone as directed- take w/ food CONTINUE the Doxycycline twice daily w/ food Continue to apply the triamcinolone cream twice daily for the itching Call with any questions or concerns Hang in there!!!

## 2018-01-29 NOTE — Progress Notes (Signed)
   Subjective:    Patient ID: Sheryl Nichols, female    DOB: 1971-07-29, 47 y.o.   MRN: [id number]  HPI Tick bites- pt was seen at [facility] on 3/31 for 2 R lower leg bites.  Pt desired tx for tick borne illnesses at that time and was started on Doxycycline BID x7 days.  Pt reports the other location is improving but the more medial bite is very itchy, hard, and now has surrounding rash.  Pt is applying triamcinolone w/o relief.  Itching is keeping her up at night   Review of Systems For ROS see HPI     Objective:   Physical Exam  Constitutional: She is oriented to person, place, and time. She appears well-developed and well-nourished. No distress.  Cardiovascular: Intact distal pulses.  Musculoskeletal: She exhibits no edema or tenderness.  Neurological: She is alert and oriented to person, place, and time.  Skin: Skin is warm and dry. There is erythema (pt has scab on R posterolateral calf and R posteromedial calf at site of previous tick bites.  has erythema and small vesicles surrounding medial tick bite consistent w/ contact dermatitis).  Psychiatric: She has a normal mood and affect. Her behavior is normal. Thought content normal.  Vitals reviewed.         Assessment & Plan:  Tick bite- new to provider.  Pt was evaluated at [facility] and tx'd w/ 1 week of Doxy.  Pt is very concerned about possible Lyme- so will extend her Doxy tx for another week for a total of 14 days.  Reviewed supportive care and red flags that should prompt return.  Pt expressed understanding and is in agreement w/ plan.   Contact Dermatitis- new.  The redness and itching surrounding her tick bite are more consistent w/ a reaction or contact dermatitis such as poison ivy.  Pt is not getting relief w/ Triamcinolone.  Start Prednisone taper.  Reviewed supportive care and red flags that should prompt return.  Pt expressed understanding and is in agreement w/ plan.   Mild persistent asthma- chronic problem.  Pt is asking  for refills on Qvar and Albuterol.  Refills sent.

## 2018-05-08 LAB — HM MAMMOGRAPHY: HM MAMMO: NORMAL (ref 0–4)

## 2018-06-08 ENCOUNTER — Ambulatory Visit (INDEPENDENT_AMBULATORY_CARE_PROVIDER_SITE_OTHER): Payer: BLUE CROSS/BLUE SHIELD | Admitting: Family Medicine

## 2018-06-08 ENCOUNTER — Other Ambulatory Visit: Payer: Self-pay

## 2018-06-08 ENCOUNTER — Other Ambulatory Visit (INDEPENDENT_AMBULATORY_CARE_PROVIDER_SITE_OTHER): Payer: BLUE CROSS/BLUE SHIELD

## 2018-06-08 ENCOUNTER — Encounter: Payer: Self-pay | Admitting: Family Medicine

## 2018-06-08 VITALS — BP 112/78 | HR 81 | Temp 97.9°F | Resp 16 | Ht 65.0 in | Wt 148.1 lb

## 2018-06-08 DIAGNOSIS — Z0000 Encounter for general adult medical examination without abnormal findings: Secondary | ICD-10-CM | POA: Insufficient documentation

## 2018-06-08 DIAGNOSIS — R300 Dysuria: Secondary | ICD-10-CM

## 2018-06-08 DIAGNOSIS — E039 Hypothyroidism, unspecified: Secondary | ICD-10-CM | POA: Diagnosis not present

## 2018-06-08 LAB — BASIC METABOLIC PANEL
BUN: 17 mg/dL (ref 6–23)
CHLORIDE: 103 meq/L (ref 96–112)
CO2: 28 mEq/L (ref 19–32)
Calcium: 9.9 mg/dL (ref 8.4–10.5)
Creatinine, Ser: 0.78 mg/dL (ref 0.40–1.20)
GFR: 84.28 mL/min (ref >60.00)
Glucose, Bld: 82 mg/dL (ref 70–99)
POTASSIUM: 3.9 meq/L (ref 3.5–5.1)
SODIUM: 138 meq/L (ref 135–145)

## 2018-06-08 LAB — POCT URINALYSIS DIPSTICK
BILIRUBIN UA: NEGATIVE
Glucose, UA: NEGATIVE
Leukocytes, UA: NEGATIVE
Nitrite, UA: NEGATIVE
Protein, UA: NEGATIVE
SPEC GRAV UA: 1.01 (ref 1.010–1.025)
UROBILINOGEN UA: 0.2 U/dL
pH, UA: 6 (ref 5.0–8.0)

## 2018-06-08 LAB — CBC WITH DIFFERENTIAL/PLATELET
Basophils Absolute: 0 10*3/uL (ref 0.0–0.1)
Basophils Relative: 0.4 % (ref 0.0–3.0)
EOS PCT: 2.1 % (ref 0.0–5.0)
Eosinophils Absolute: 0.2 10*3/uL (ref 0.0–0.7)
HCT: 39.7 % (ref 36.0–46.0)
Hemoglobin: 13.4 g/dL (ref 12.0–15.0)
Lymphocytes Relative: 36.2 % (ref 12.0–46.0)
Lymphs Abs: 3.1 10*3/uL (ref 0.7–4.0)
MCHC: 33.7 g/dL (ref 30.0–36.0)
MCV: 94.7 fl (ref 78.0–100.0)
MONO ABS: 0.6 10*3/uL (ref 0.1–1.0)
Monocytes Relative: 6.9 % (ref 3.0–12.0)
NEUTROS ABS: 4.7 10*3/uL (ref 1.4–7.7)
NEUTROS PCT: 54.4 % (ref 43.0–77.0)
PLATELETS: 279 10*3/uL (ref 150.0–400.0)
RBC: 4.2 Mil/uL (ref 3.87–5.11)
RDW: 12.4 % (ref 11.5–15.5)
WBC: 8.6 10*3/uL (ref 4.0–10.5)

## 2018-06-08 LAB — LIPID PANEL
CHOLESTEROL: 217 mg/dL — AB (ref 0–200)
HDL: 88.1 mg/dL (ref >39.00)
LDL CALC: 111 mg/dL — AB (ref 0–99)
NonHDL: 129.29
TRIGLYCERIDES: 89 mg/dL (ref 0.0–149.0)
Total CHOL/HDL Ratio: 2
VLDL: 17.8 mg/dL (ref 0.0–40.0)

## 2018-06-08 LAB — TSH: TSH: 1.39 u[IU]/mL (ref 0.35–4.50)

## 2018-06-08 LAB — HEPATIC FUNCTION PANEL
ALT: 26 U/L (ref 0–35)
AST: 23 U/L (ref 0–37)
Albumin: 4.7 g/dL (ref 3.5–5.2)
Alkaline Phosphatase: 45 U/L (ref 39–117)
Bilirubin, Direct: 0.1 mg/dL (ref 0.0–0.3)
Total Bilirubin: 0.6 mg/dL (ref 0.2–1.2)
Total Protein: 7 g/dL (ref 6.0–8.3)

## 2018-06-08 MED ORDER — FLUTICASONE PROPIONATE 50 MCG/ACT NA SUSP: 2.0000 | Freq: Every day | NASAL | 6 refills | Status: DC

## 2018-06-08 NOTE — Assessment & Plan Note (Signed)
Pt's PE WNL w/ exception of retracted TMs and nasal allergies.  UTD on GYN.  Check labs.  Anticipatory guidance provided.

## 2018-06-08 NOTE — Progress Notes (Signed)
   Subjective:    Patient ID: Sheryl Nichols, female    DOB: 05/02/1971, 47 y.o.   MRN: [id number]  HPI CPE- UTD on pap, mammo, Tdap.   Review of Systems Patient reports no vision changes, adenopathy,fever, weight change,  persistant/recurrent hoarseness , swallowing issues, chest pain, palpitations, edema, persistant/recurrent cough, hemoptysis, dyspnea (rest/exertional/paroxysmal nocturnal), gastrointestinal bleeding (melena, rectal bleeding), abdominal pain, significant heartburn, bowel changes, GU symptoms (dysuria, hematuria, incontinence), Gyn symptoms (abnormal  bleeding, pain),  syncope, focal weakness, memory loss, numbness & tingling, skin/hair/nail changes, abnormal bruising or bleeding, anxiety, or depression.   + R shoulder/neck tightness- sxs started last week.  No known injury.  No weakness/numbness.  + decreased hearing    Objective:   Physical Exam General Appearance:    Alert, cooperative, no distress, appears stated age  Head:    Normocephalic, without obvious abnormality, atraumatic  Eyes:    PERRL, conjunctiva/corneas clear, EOM's intact, fundi    benign, both eyes  Ears:    TMs retracted bilaterally  Nose:   Nares normal, septum midline, mucosa normal, no drainage    or sinus tenderness  Throat:   Lips, mucosa, and tongue normal; teeth and gums normal  Neck:   Supple, symmetrical, trachea midline, no adenopathy;    Thyroid: no enlargement/tenderness/nodules  Back:     Symmetric, no curvature, ROM normal, no CVA tenderness  Lungs:     Clear to auscultation bilaterally, respirations unlabored  Chest Wall:    No tenderness or deformity   Heart:    Regular rate and rhythm, S1 and S2 normal, no murmur, rub   or gallop  Breast Exam:    Deferred to GYN  Abdomen:     Soft, non-tender, bowel sounds active all four quadrants,    no masses, no organomegaly  Genitalia:    Deferred to GYN  Rectal:    Extremities:   Extremities normal, atraumatic, no cyanosis or edema   Pulses:   2+ and symmetric all extremities  Skin:   Skin color, texture, turgor normal, no rashes or lesions  Lymph nodes:   Cervical, supraclavicular, and axillary nodes normal  Neurologic:   CNII-XII intact, normal strength, sensation and reflexes    throughout          Assessment & Plan:

## 2018-06-08 NOTE — Assessment & Plan Note (Signed)
Chronic problem.  Pt is not taking medication at this time.  Currently asymptomatic.  Check labs and adjust tx plan prn.

## 2018-06-08 NOTE — Addendum Note (Signed)
Addended by: Leonidas Romberg on: 06/08/2018 03:37 PM   Modules accepted: Orders

## 2018-06-08 NOTE — Patient Instructions (Signed)
Follow up in 1 year or as needed We'll notify you of your lab results and make any changes if needed Continue to work on healthy diet and regular exercise- you look great!!! Your neck pain is consistent w/ intermittent spasm- try switching your pillow ADD the Flonase- 2 sprays each nostril daily- to improve congestion and hearing Call with any questions or concerns Enjoy the rest of your summer!!!

## 2018-06-09 LAB — URINE CULTURE
MICRO NUMBER: 90977359
Result:: NO GROWTH
SPECIMEN QUALITY:: ADEQUATE

## 2018-06-11 ENCOUNTER — Encounter: Payer: Self-pay | Admitting: General Practice

## 2019-03-01 ENCOUNTER — Other Ambulatory Visit: Payer: Self-pay | Admitting: Family Medicine

## 2019-03-01 DIAGNOSIS — J4531 Mild persistent asthma with (acute) exacerbation: Secondary | ICD-10-CM

## 2019-04-01 ENCOUNTER — Other Ambulatory Visit: Payer: Self-pay | Admitting: Family Medicine

## 2019-04-22 LAB — HM MAMMOGRAPHY: HM Mammogram: NORMAL (ref 0–4)

## 2019-06-17 ENCOUNTER — Telehealth: Payer: Self-pay | Admitting: *Deleted

## 2019-06-17 NOTE — Telephone Encounter (Signed)
Patient called in to schedule her CPE. States that she has had a problem in the past with cold sore which she normally treats OTC. She has been under a lot of stress and is asking if there is a medication that Dr. Birdie Riddle is willing to call in to treat her current cold sore. Advised patient that a visit may be necessary, but she requested that I send a note to ask since she has had these in the past.

## 2019-06-18 MED ORDER — VALACYCLOVIR HCL 1 G PO TABS
ORAL_TABLET | ORAL | 1 refills | Status: DC
Start: 2019-06-18 — End: 2019-08-22

## 2019-06-18 NOTE — Telephone Encounter (Signed)
Prescription sent to pharmacy.

## 2019-06-18 NOTE — Addendum Note (Signed)
Addended by: Midge Minium on: 06/18/2019 07:32 AM   Modules accepted: Orders

## 2019-07-09 ENCOUNTER — Telehealth: Payer: Self-pay | Admitting: Family Medicine

## 2019-07-09 NOTE — Telephone Encounter (Signed)
Unclear if we can specify from 2018 forward but we may be able to click on the historical meds and print her whole list

## 2019-07-09 NOTE — Telephone Encounter (Signed)
Pt called in asking for a list of all the medications she was prescribed from 2018 to present. She needs the medication name, dose, refills, and providers name. Pt can be reached at the home #

## 2019-07-09 NOTE — Telephone Encounter (Signed)
Do you know if there is a dot phrase that will bring them all up.

## 2019-07-10 NOTE — Telephone Encounter (Signed)
Informed pt that we were able to print letter with meds since 2018. Placed in mail as requested.

## 2019-07-11 ENCOUNTER — Other Ambulatory Visit: Payer: Self-pay | Admitting: General Practice

## 2019-07-11 DIAGNOSIS — J4531 Mild persistent asthma with (acute) exacerbation: Secondary | ICD-10-CM

## 2019-07-11 MED ORDER — QVAR REDIHALER 80 MCG/ACT IN AERB: 2.0000 | INHALATION_SPRAY | Freq: Two times a day (BID) | RESPIRATORY_TRACT | 3 refills | Status: DC

## 2019-08-22 ENCOUNTER — Ambulatory Visit (INDEPENDENT_AMBULATORY_CARE_PROVIDER_SITE_OTHER): Payer: BC Managed Care – PPO | Admitting: Family Medicine

## 2019-08-22 ENCOUNTER — Encounter: Payer: Self-pay | Admitting: Family Medicine

## 2019-08-22 ENCOUNTER — Other Ambulatory Visit: Payer: Self-pay

## 2019-08-22 ENCOUNTER — Telehealth: Payer: Self-pay

## 2019-08-22 VITALS — BP 121/83 | HR 82 | Temp 98.1°F | Resp 16 | Ht 65.0 in | Wt 150.4 lb

## 2019-08-22 DIAGNOSIS — N390 Urinary tract infection, site not specified: Secondary | ICD-10-CM | POA: Diagnosis not present

## 2019-08-22 DIAGNOSIS — E039 Hypothyroidism, unspecified: Secondary | ICD-10-CM | POA: Diagnosis not present

## 2019-08-22 DIAGNOSIS — R319 Hematuria, unspecified: Secondary | ICD-10-CM

## 2019-08-22 DIAGNOSIS — Z0000 Encounter for general adult medical examination without abnormal findings: Secondary | ICD-10-CM

## 2019-08-22 HISTORY — DX: Urinary tract infection, site not specified: N39.0

## 2019-08-22 LAB — POCT URINALYSIS DIPSTICK
Bilirubin, UA: NEGATIVE
Glucose, UA: NEGATIVE
Ketones, UA: NEGATIVE
Leukocytes, UA: NEGATIVE
Nitrite, UA: NEGATIVE
Protein, UA: NEGATIVE
Spec Grav, UA: <=1.005 — AB (ref 1.010–1.025)
Urobilinogen, UA: 0.2 E.U./dL
pH, UA: 6 (ref 5.0–8.0)

## 2019-08-22 LAB — CBC WITH DIFFERENTIAL/PLATELET
Basophils Absolute: 0 10*3/uL (ref 0.0–0.1)
Basophils Relative: 0.6 % (ref 0.0–3.0)
Eosinophils Absolute: 0.1 10*3/uL (ref 0.0–0.7)
Eosinophils Relative: 0.8 % (ref 0.0–5.0)
HCT: 39.9 % (ref 36.0–46.0)
Hemoglobin: 13.4 g/dL (ref 12.0–15.0)
Lymphocytes Relative: 31.9 % (ref 12.0–46.0)
Lymphs Abs: 2.3 10*3/uL (ref 0.7–4.0)
MCHC: 33.6 g/dL (ref 30.0–36.0)
MCV: 94.8 fl (ref 78.0–100.0)
Monocytes Absolute: 0.4 10*3/uL (ref 0.1–1.0)
Monocytes Relative: 6.1 % (ref 3.0–12.0)
Neutro Abs: 4.3 10*3/uL (ref 1.4–7.7)
Neutrophils Relative %: 60.6 % (ref 43.0–77.0)
Platelets: 336 10*3/uL (ref 150.0–400.0)
RBC: 4.21 Mil/uL (ref 3.87–5.11)
RDW: 12.6 % (ref 11.5–15.5)
WBC: 7.1 10*3/uL (ref 4.0–10.5)

## 2019-08-22 LAB — BASIC METABOLIC PANEL
BUN: 13 mg/dL (ref 6–23)
CO2: 28 mEq/L (ref 19–32)
Calcium: 9.6 mg/dL (ref 8.4–10.5)
Chloride: 103 mEq/L (ref 96–112)
Creatinine, Ser: 0.66 mg/dL (ref 0.40–1.20)
GFR: 95.65 mL/min (ref >60.00)
Glucose, Bld: 93 mg/dL (ref 70–99)
Potassium: 3.8 mEq/L (ref 3.5–5.1)
Sodium: 139 mEq/L (ref 135–145)

## 2019-08-22 LAB — LIPID PANEL
Cholesterol: 255 mg/dL — ABNORMAL HIGH (ref 0–200)
HDL: 91.9 mg/dL (ref >39.00)
LDL Cholesterol: 148 mg/dL — ABNORMAL HIGH (ref 0–99)
NonHDL: 163.32
Total CHOL/HDL Ratio: 3
Triglycerides: 76 mg/dL (ref 0.0–149.0)
VLDL: 15.2 mg/dL (ref 0.0–40.0)

## 2019-08-22 LAB — HEPATIC FUNCTION PANEL
ALT: 17 U/L (ref 0–35)
AST: 18 U/L (ref 0–37)
Albumin: 4.7 g/dL (ref 3.5–5.2)
Alkaline Phosphatase: 45 U/L (ref 39–117)
Bilirubin, Direct: 0.1 mg/dL (ref 0.0–0.3)
Total Bilirubin: 0.5 mg/dL (ref 0.2–1.2)
Total Protein: 6.9 g/dL (ref 6.0–8.3)

## 2019-08-22 NOTE — Patient Instructions (Signed)
Follow up in 1 year or as needed We'll notify you of your lab results and make any changes if needed Keep up the good work on healthy diet and regular exercise- you look great!!! Call with any questions or concerns Stay Safe!  Stay Healthy! 

## 2019-08-22 NOTE — Telephone Encounter (Signed)
Patient requesting that lab and UA results be sent to OB/GYN and Dr. Vickii Penna, MD with [facility] fax # [phone]

## 2019-08-22 NOTE — Assessment & Plan Note (Signed)
Pt would like urine dip and Cr to assess for 'kidney damage'.  She reports she is currently menstruating so the dip may not be accurate.

## 2019-08-22 NOTE — Assessment & Plan Note (Signed)
Pt's PE WNL.  UTD on pap, mammo, immunizations.  Check labs.  Anticipatory guidance provided.

## 2019-08-22 NOTE — Assessment & Plan Note (Signed)
Chronic problem, currently asymptomatic.  Check labs and start meds prn.

## 2019-08-22 NOTE — Progress Notes (Signed)
   Subjective:    Patient ID: Sheryl Nichols, female    DOB: [birth year], 48 y.o.   MRN: [id number]  HPI CPE- UTD on pap, mammo, Tdap, flu.   Review of Systems Patient reports no vision/ hearing changes, adenopathy,fever, weight change,  persistant/recurrent hoarseness , swallowing issues, chest pain, palpitations, edema, persistant/recurrent cough, hemoptysis, dyspnea (rest/exertional/paroxysmal nocturnal), gastrointestinal bleeding (melena, rectal bleeding), abdominal pain, significant heartburn, bowel changes, GU symptoms (dysuria, hematuria, incontinence), Gyn symptoms (abnormal  bleeding, pain),  syncope, focal weakness, memory loss, numbness & tingling, skin/hair/nail changes, abnormal bruising or bleeding, anxiety, or depression.     Objective:   Physical Exam General Appearance:    Alert, cooperative, no distress, appears stated age  Head:    Normocephalic, without obvious abnormality, atraumatic  Eyes:    PERRL, conjunctiva/corneas clear, EOM's intact, fundi    benign, both eyes  Ears:    Normal TM's and external ear canals, both ears  Nose:   Deferred due to COVID  Throat:   Neck:   Supple, symmetrical, trachea midline, no adenopathy;    Thyroid: no enlargement/tenderness/nodules  Back:     Symmetric, no curvature, ROM normal, no CVA tenderness  Lungs:     Clear to auscultation bilaterally, respirations unlabored  Chest Wall:    No tenderness or deformity   Heart:    Regular rate and rhythm, S1 and S2 normal, no murmur, rub   or gallop  Breast Exam:    Deferred to GYN  Abdomen:     Soft, non-tender, bowel sounds active all four quadrants,    no masses, no organomegaly  Genitalia:    Deferred to GYN  Rectal:    Extremities:   Extremities normal, atraumatic, no cyanosis or edema  Pulses:   2+ and symmetric all extremities  Skin:   Skin color, texture, turgor normal, no rashes or lesions  Lymph nodes:   Cervical, supraclavicular, and axillary nodes normal  Neurologic:    CNII-XII intact, normal strength, sensation and reflexes    throughout          Assessment & Plan:

## 2019-08-23 ENCOUNTER — Encounter: Payer: Self-pay | Admitting: General Practice

## 2019-08-23 LAB — TSH: TSH: 1.55 u[IU]/mL (ref 0.35–4.50)

## 2019-08-23 NOTE — Telephone Encounter (Signed)
Labs were faxed today.

## 2019-08-24 LAB — URINE CULTURE
MICRO NUMBER:: 1047259
Result:: NO GROWTH
SPECIMEN QUALITY:: ADEQUATE

## 2019-11-01 ENCOUNTER — Other Ambulatory Visit: Payer: Self-pay | Admitting: *Deleted

## 2019-11-01 MED ORDER — FLUTICASONE PROPIONATE 50 MCG/ACT NA SUSP: NASAL | 6 refills | Status: DC

## 2019-11-16 ENCOUNTER — Encounter: Payer: Self-pay | Admitting: Family Medicine

## 2019-11-25 ENCOUNTER — Other Ambulatory Visit: Payer: Self-pay | Admitting: General Practice

## 2019-11-25 DIAGNOSIS — J4531 Mild persistent asthma with (acute) exacerbation: Secondary | ICD-10-CM

## 2019-11-25 MED ORDER — QVAR REDIHALER 80 MCG/ACT IN AERB: 2.0000 | INHALATION_SPRAY | Freq: Two times a day (BID) | RESPIRATORY_TRACT | 3 refills | Status: DC

## 2020-01-15 ENCOUNTER — Encounter: Payer: Self-pay | Admitting: Family Medicine

## 2020-02-29 ENCOUNTER — Encounter: Payer: Self-pay | Admitting: Family Medicine

## 2020-03-04 ENCOUNTER — Ambulatory Visit (INDEPENDENT_AMBULATORY_CARE_PROVIDER_SITE_OTHER): Payer: BC Managed Care – PPO | Admitting: Family Medicine

## 2020-03-04 ENCOUNTER — Other Ambulatory Visit: Payer: Self-pay

## 2020-03-04 ENCOUNTER — Encounter: Payer: Self-pay | Admitting: Family Medicine

## 2020-03-04 VITALS — BP 122/80 | HR 76 | Temp 97.9°F | Resp 16 | Ht 65.0 in | Wt 150.2 lb

## 2020-03-04 DIAGNOSIS — N6310 Unspecified lump in the right breast, unspecified quadrant: Secondary | ICD-10-CM

## 2020-03-04 DIAGNOSIS — N644 Mastodynia: Secondary | ICD-10-CM

## 2020-03-04 NOTE — Patient Instructions (Signed)
Follow up as needed or as scheduled We'll call you with your imaging appt Ibuprofen for breast pain Call with any questions or concerns Hang in there!!!

## 2020-03-04 NOTE — Progress Notes (Signed)
   Subjective:    Patient ID: Sheryl Nichols, female    DOB: [birth year], 49 y.o.   MRN: [id number]  HPI Breast pain- 'I feel it constantly.  Almost like it's tugging'.  R sided, almost in axilla.  Unable to sleep due to discomfort.  Has worsened over the past couple weeks.  Pt reports feeling a lump.  UTD on mammo (done June 2020)   Review of Systems For ROS see HPI   This visit occurred during the SARS-CoV-2 public health emergency.  Safety protocols were in place, including screening questions prior to the visit, additional usage of staff PPE, and extensive cleaning of exam room while observing appropriate contact time as indicated for disinfecting solutions.       Objective:   Physical Exam Vitals reviewed.  Constitutional:      General: She is not in acute distress.    Appearance: Normal appearance. She is not ill-appearing.  HENT:     Head: Normocephalic and atraumatic.  Chest:     Breasts:        Right: Mass (at lateral edge of R breast between 9-10' o'clock and TTP) and tenderness present. No nipple discharge or skin change.        Left: No mass, nipple discharge, skin change or tenderness.     Comments: Bilateral implants Lymphadenopathy:     Upper Body:     Right upper body: No supraclavicular, axillary or pectoral adenopathy.     Left upper body: No supraclavicular, axillary or pectoral adenopathy.           Assessment & Plan:  Breast mass/pain- new.  Pt reports she noticed this a few weeks ago but discomfort has been worsening and is now constant.  Palpable lump between 9-10 o'clock at lateral edge of breast.  +TTP.  Needs diagnostic imaging- orders entered.  Pt to take ibuprofen as needed for breast pain.  Pt expressed understanding and is in agreement w/ plan.

## 2020-03-31 ENCOUNTER — Other Ambulatory Visit: Payer: Self-pay | Admitting: General Practice

## 2020-03-31 DIAGNOSIS — J4531 Mild persistent asthma with (acute) exacerbation: Secondary | ICD-10-CM

## 2020-03-31 MED ORDER — QVAR REDIHALER 80 MCG/ACT IN AERB: 2.0000 | INHALATION_SPRAY | Freq: Two times a day (BID) | RESPIRATORY_TRACT | 3 refills | Status: DC

## 2020-04-13 ENCOUNTER — Ambulatory Visit (INDEPENDENT_AMBULATORY_CARE_PROVIDER_SITE_OTHER): Payer: BC Managed Care – PPO | Admitting: Family Medicine

## 2020-04-13 ENCOUNTER — Other Ambulatory Visit: Payer: Self-pay

## 2020-04-13 ENCOUNTER — Encounter: Payer: Self-pay | Admitting: Family Medicine

## 2020-04-13 VITALS — BP 122/86 | HR 76 | Temp 97.9°F | Resp 16 | Ht 65.0 in | Wt 157.0 lb

## 2020-04-13 DIAGNOSIS — J4540 Moderate persistent asthma, uncomplicated: Secondary | ICD-10-CM | POA: Diagnosis not present

## 2020-04-13 DIAGNOSIS — Z8249 Family history of ischemic heart disease and other diseases of the circulatory system: Secondary | ICD-10-CM

## 2020-04-13 DIAGNOSIS — R5383 Other fatigue: Secondary | ICD-10-CM | POA: Diagnosis not present

## 2020-04-13 DIAGNOSIS — R0789 Other chest pain: Secondary | ICD-10-CM | POA: Diagnosis not present

## 2020-04-13 LAB — CBC WITH DIFFERENTIAL/PLATELET
Basophils Absolute: 0 10*3/uL (ref 0.0–0.1)
Basophils Relative: 0.6 % (ref 0.0–3.0)
Eosinophils Absolute: 0.1 10*3/uL (ref 0.0–0.7)
Eosinophils Relative: 0.8 % (ref 0.0–5.0)
HCT: 37.7 % (ref 36.0–46.0)
Hemoglobin: 12.9 g/dL (ref 12.0–15.0)
Lymphocytes Relative: 30.1 % (ref 12.0–46.0)
Lymphs Abs: 2.1 10*3/uL (ref 0.7–4.0)
MCHC: 34.2 g/dL (ref 30.0–36.0)
MCV: 94.6 fl (ref 78.0–100.0)
Monocytes Absolute: 0.5 10*3/uL (ref 0.1–1.0)
Monocytes Relative: 7.3 % (ref 3.0–12.0)
Neutro Abs: 4.3 10*3/uL (ref 1.4–7.7)
Neutrophils Relative %: 61.2 % (ref 43.0–77.0)
Platelets: 312 10*3/uL (ref 150.0–400.0)
RBC: 3.99 Mil/uL (ref 3.87–5.11)
RDW: 13.3 % (ref 11.5–15.5)
WBC: 7 10*3/uL (ref 4.0–10.5)

## 2020-04-13 LAB — BASIC METABOLIC PANEL
BUN: 12 mg/dL (ref 6–23)
CO2: 27 mEq/L (ref 19–32)
Calcium: 9.2 mg/dL (ref 8.4–10.5)
Chloride: 105 mEq/L (ref 96–112)
Creatinine, Ser: 0.65 mg/dL (ref 0.40–1.20)
GFR: 97.09 mL/min (ref >60.00)
Glucose, Bld: 95 mg/dL (ref 70–99)
Potassium: 3.9 mEq/L (ref 3.5–5.1)
Sodium: 138 mEq/L (ref 135–145)

## 2020-04-13 LAB — HEPATIC FUNCTION PANEL
ALT: 29 U/L (ref 0–35)
AST: 20 U/L (ref 0–37)
Albumin: 4.4 g/dL (ref 3.5–5.2)
Alkaline Phosphatase: 49 U/L (ref 39–117)
Bilirubin, Direct: 0.1 mg/dL (ref 0.0–0.3)
Total Bilirubin: 0.5 mg/dL (ref 0.2–1.2)
Total Protein: 6.4 g/dL (ref 6.0–8.3)

## 2020-04-13 LAB — TSH: TSH: 1.52 u[IU]/mL (ref 0.35–4.50)

## 2020-04-13 MED ORDER — BUDESONIDE-FORMOTEROL FUMARATE 160-4.5 MCG/ACT IN AERO
2.0000 | INHALATION_SPRAY | Freq: Two times a day (BID) | RESPIRATORY_TRACT | 3 refills | Status: DC
Start: 2020-04-13 — End: 2020-06-05

## 2020-04-13 MED ORDER — CETIRIZINE HCL 10 MG PO TABS: 10.0000 mg | ORAL_TABLET | Freq: Every day | ORAL | 11 refills | Status: DC

## 2020-04-13 NOTE — Assessment & Plan Note (Signed)
Deteriorated.  Pt is having to use albuterol more frequently.  Will switch to Symbicort in hopes of decreasing need for rescue inhaler.  Pt expressed understanding and is in agreement w/ plan.

## 2020-04-13 NOTE — Progress Notes (Signed)
   Subjective:    Patient ID: Sheryl Nichols, female    DOB: September 09, 1971, 49 y.o.   MRN: [id number]  HPI Fatigue- 'i'm tired'.  Not taking Levothyroxine x3 yrs but has hx of hypothyroid.  Also under considerable stress w/ divorce and her kids  SOB- reports she is using her inhaler more often. Pt reports tightness in L chest that radiates to shoulder blade and armpit.  'it felt like someone was sitting on my chest'- 1 episode.  + family hx of heart disease.  Wondered if she was having an anxiety attack.  Using Qvar twice daily.  Has used albuterol 'up to 3-4x/day'.  Pt is having to clear her throat regularly- not taking any allergy medication on a regular basis.   Review of Systems For ROS see HPI   This visit occurred during the SARS-CoV-2 public health emergency.  Safety protocols were in place, including screening questions prior to the visit, additional usage of staff PPE, and extensive cleaning of exam room while observing appropriate contact time as indicated for disinfecting solutions.     Objective:   Physical Exam Vitals reviewed.  Constitutional:      General: She is not in acute distress.    Appearance: She is well-developed. She is not ill-appearing.  HENT:     Head: Normocephalic and atraumatic.  Eyes:     Conjunctiva/sclera: Conjunctivae normal.     Pupils: Pupils are equal, round, and reactive to light.  Neck:     Thyroid: No thyromegaly.  Cardiovascular:     Rate and Rhythm: Normal rate and regular rhythm.     Heart sounds: Normal heart sounds. No murmur heard.   Pulmonary:     Effort: Pulmonary effort is normal. No respiratory distress.     Breath sounds: Normal breath sounds.  Abdominal:     General: There is no distension.     Palpations: Abdomen is soft.     Tenderness: There is no abdominal tenderness.  Musculoskeletal:     Cervical back: Normal range of motion and neck supple.  Lymphadenopathy:     Cervical: No cervical adenopathy.  Skin:     General: Skin is warm and dry.  Neurological:     Mental Status: She is alert and oriented to person, place, and time.  Psychiatric:        Behavior: Behavior normal.           Assessment & Plan:  Fatigue- likely multifactorial (stress, asthma) but will check labs to r/o metabolic cause and address any abnormalities if present.  Pt expressed understanding and is in agreement w/ plan.   Chest Heaviness- new.  EKG WNL today and pt is currently asymptomatic, but given her family history of early CAD will refer for stress testing.  Pt expressed understanding and is in agreement w/ plan.

## 2020-04-13 NOTE — Assessment & Plan Note (Signed)
Pt's younger brother has had MI x3.  Given her recent chest heaviness and SOB along w/ family hx, will get stress test to assess.  Pt expressed understanding and is in agreement w/ plan.

## 2020-04-13 NOTE — Patient Instructions (Addendum)
Follow up as needed or as scheduled We'll notify you of your lab results and make any changes if needed We'll call you to schedule your stress test STOP the Qvar START the Symbicort 2 puffs twice daily START the Cetirizine (Zyrtec) daily IF you have chest pressure, worsening shortness of breath, or other concerns- please go to ER Call with any questions or concerns Hang in there!

## 2020-04-15 ENCOUNTER — Other Ambulatory Visit: Payer: Self-pay | Admitting: General Practice

## 2020-04-15 MED ORDER — ALBUTEROL SULFATE HFA 108 (90 BASE) MCG/ACT IN AERS: 2.0000 | INHALATION_SPRAY | Freq: Four times a day (QID) | RESPIRATORY_TRACT | 1 refills | Status: DC | PRN

## 2020-04-22 ENCOUNTER — Telehealth (HOSPITAL_COMMUNITY): Payer: Self-pay

## 2020-04-22 NOTE — Telephone Encounter (Signed)
Encounter complete. 

## 2020-04-23 ENCOUNTER — Telehealth (HOSPITAL_COMMUNITY): Payer: Self-pay

## 2020-04-23 NOTE — Telephone Encounter (Signed)
Encounter complete. 

## 2020-04-24 ENCOUNTER — Ambulatory Visit (HOSPITAL_COMMUNITY)
Admission: RE | Admit: 2020-04-24 | Discharge: 2020-04-24 | Disposition: A | Discharge status: Home / Self Care | Payer: Self-pay | Source: Ambulatory Visit | Attending: Cardiology | Admitting: Cardiology

## 2020-04-24 ENCOUNTER — Other Ambulatory Visit: Payer: Self-pay

## 2020-04-24 DIAGNOSIS — R0789 Other chest pain: Secondary | ICD-10-CM | POA: Insufficient documentation

## 2020-04-24 DIAGNOSIS — R5383 Other fatigue: Secondary | ICD-10-CM | POA: Insufficient documentation

## 2020-04-24 DIAGNOSIS — Z8249 Family history of ischemic heart disease and other diseases of the circulatory system: Secondary | ICD-10-CM | POA: Insufficient documentation

## 2020-04-24 LAB — EXERCISE TOLERANCE TEST
Estimated workload: 11.7 METS
Exercise duration (min): 10 min
Exercise duration (sec): 0 s
MPHR: 172 {beats}/min
Peak HR: 169 {beats}/min
Percent HR: 98 %
Rest HR: 74 {beats}/min

## 2020-06-02 ENCOUNTER — Other Ambulatory Visit: Payer: Self-pay | Admitting: General Practice

## 2020-06-02 MED ORDER — ALBUTEROL SULFATE HFA 108 (90 BASE) MCG/ACT IN AERS: 2.0000 | INHALATION_SPRAY | Freq: Four times a day (QID) | RESPIRATORY_TRACT | 1 refills | Status: DC | PRN

## 2020-06-04 ENCOUNTER — Other Ambulatory Visit: Payer: Self-pay | Admitting: Family Medicine

## 2020-07-02 ENCOUNTER — Other Ambulatory Visit: Payer: Self-pay | Admitting: Family Medicine

## 2020-08-28 ENCOUNTER — Other Ambulatory Visit: Payer: Self-pay

## 2020-08-28 ENCOUNTER — Encounter: Payer: Self-pay | Admitting: Family Medicine

## 2020-08-28 ENCOUNTER — Ambulatory Visit (INDEPENDENT_AMBULATORY_CARE_PROVIDER_SITE_OTHER): Payer: BC Managed Care – PPO | Admitting: Family Medicine

## 2020-08-28 VITALS — BP 130/78 | HR 86 | Temp 97.8°F | Resp 20 | Ht 64.0 in | Wt 166.8 lb

## 2020-08-28 DIAGNOSIS — Z1211 Encounter for screening for malignant neoplasm of colon: Secondary | ICD-10-CM | POA: Diagnosis not present

## 2020-08-28 DIAGNOSIS — Z23 Encounter for immunization: Secondary | ICD-10-CM

## 2020-08-28 DIAGNOSIS — J4531 Mild persistent asthma with (acute) exacerbation: Secondary | ICD-10-CM

## 2020-08-28 DIAGNOSIS — E663 Overweight: Secondary | ICD-10-CM | POA: Diagnosis not present

## 2020-08-28 DIAGNOSIS — Z0000 Encounter for general adult medical examination without abnormal findings: Secondary | ICD-10-CM

## 2020-08-28 LAB — BASIC METABOLIC PANEL
BUN: 9 mg/dL (ref 6–23)
CO2: 28 mEq/L (ref 19–32)
Calcium: 9.5 mg/dL (ref 8.4–10.5)
Chloride: 103 mEq/L (ref 96–112)
Creatinine, Ser: 0.69 mg/dL (ref 0.40–1.20)
GFR: 102.32 mL/min (ref >60.00)
Glucose, Bld: 93 mg/dL (ref 70–99)
Potassium: 3.9 mEq/L (ref 3.5–5.1)
Sodium: 139 mEq/L (ref 135–145)

## 2020-08-28 LAB — LIPID PANEL
Cholesterol: 249 mg/dL — ABNORMAL HIGH (ref 0–200)
HDL: 98.6 mg/dL (ref >39.00)
LDL Cholesterol: 135 mg/dL — ABNORMAL HIGH (ref 0–99)
NonHDL: 150.54
Total CHOL/HDL Ratio: 3
Triglycerides: 77 mg/dL (ref 0.0–149.0)
VLDL: 15.4 mg/dL (ref 0.0–40.0)

## 2020-08-28 LAB — HEPATIC FUNCTION PANEL
ALT: 41 U/L — ABNORMAL HIGH (ref 0–35)
AST: 31 U/L (ref 0–37)
Albumin: 4.5 g/dL (ref 3.5–5.2)
Alkaline Phosphatase: 57 U/L (ref 39–117)
Bilirubin, Direct: 0.1 mg/dL (ref 0.0–0.3)
Total Bilirubin: 0.6 mg/dL (ref 0.2–1.2)
Total Protein: 6.9 g/dL (ref 6.0–8.3)

## 2020-08-28 LAB — CBC WITH DIFFERENTIAL/PLATELET
Basophils Absolute: 0 10*3/uL (ref 0.0–0.1)
Basophils Relative: 0.5 % (ref 0.0–3.0)
Eosinophils Absolute: 0 10*3/uL (ref 0.0–0.7)
Eosinophils Relative: 0.2 % (ref 0.0–5.0)
HCT: 39.3 % (ref 36.0–46.0)
Hemoglobin: 13.3 g/dL (ref 12.0–15.0)
Lymphocytes Relative: 20.3 % (ref 12.0–46.0)
Lymphs Abs: 1.4 10*3/uL (ref 0.7–4.0)
MCHC: 33.8 g/dL (ref 30.0–36.0)
MCV: 94.6 fl (ref 78.0–100.0)
Monocytes Absolute: 0.3 10*3/uL (ref 0.1–1.0)
Monocytes Relative: 4.4 % (ref 3.0–12.0)
Neutro Abs: 5.3 10*3/uL (ref 1.4–7.7)
Neutrophils Relative %: 74.6 % (ref 43.0–77.0)
Platelets: 338 10*3/uL (ref 150.0–400.0)
RBC: 4.16 Mil/uL (ref 3.87–5.11)
RDW: 12.9 % (ref 11.5–15.5)
WBC: 7.1 10*3/uL (ref 4.0–10.5)

## 2020-08-28 LAB — TSH: TSH: 1.25 u[IU]/mL (ref 0.35–4.50)

## 2020-08-28 MED ORDER — CETIRIZINE HCL 10 MG PO TABS
10.0000 mg | ORAL_TABLET | Freq: Every day | ORAL | 11 refills | Status: DC
Start: 2020-08-28 — End: 2021-09-22

## 2020-08-28 MED ORDER — FLUTICASONE PROPIONATE 50 MCG/ACT NA SUSP: 2.0000 | Freq: Every day | NASAL | 11 refills | Status: DC

## 2020-08-28 MED ORDER — QVAR REDIHALER 80 MCG/ACT IN AERB: 2.0000 | INHALATION_SPRAY | Freq: Two times a day (BID) | RESPIRATORY_TRACT | 3 refills | Status: DC

## 2020-08-28 NOTE — Patient Instructions (Addendum)
Follow up in 1 year or as needed We'll notify you of your lab results and make any changes if needed Continue to work on healthy diet and regular exercise- you can do it! We'll call you with your GI appt to discuss colonoscopy Call and ask GYN about your last pap- and have them send me the results Call with any questions or concerns Stay Safe!  Stay Healthy! Happy Holidays!!

## 2020-08-28 NOTE — Assessment & Plan Note (Signed)
Pt's PE WNL w/ exception of diffuse thyroid enlargement.  UTD on Tdap, COVID.  Flu shot given today.  Referral for colon cancer screening placed.  UTD on mammo.  Encouraged her to call and determine when her last pap was.  Check labs.  Anticipatory guidance provided.

## 2020-08-28 NOTE — Progress Notes (Signed)
   Subjective:    Patient ID: Sheryl Nichols, female    DOB: [birth year], 49 y.o.   MRN: [id number]  HPI CPE- UTD on mammo, Tdap, COVID vaccines.  Due for pap- Dr Ronita Hipps.  Due to start colon cancer screen.  Reviewed past medical, surgical, family and social histories.   Patient Care Team    Relationship Specialty Notifications Start End  Midge Minium, MD PCP - General Family Medicine  12/20/13   Brien Few, MD Consulting Physician Obstetrics and Gynecology  06/08/18     Health Maintenance  Topic Date Due  . HIV Screening  Never done  . INFLUENZA VACCINE  05/24/2020  . PAP SMEAR-Modifier  08/28/2020 (Originally 06/08/2020)  . Hepatitis C Screening  04/13/2021 (Originally 18-Nov-1970)  . MAMMOGRAM  03/12/2021  . TETANUS/TDAP  06/05/2023  . COVID-19 Vaccine  Completed     Review of Systems Patient reports no vision/ hearing changes, adenopathy,fever, persistant/recurrent hoarseness , swallowing issues, chest pain, palpitations, edema, persistant/recurrent cough, hemoptysis, dyspnea (rest/exertional/paroxysmal nocturnal), gastrointestinal bleeding (melena, rectal bleeding), abdominal pain, significant heartburn, bowel changes, GU symptoms (dysuria, hematuria, incontinence), Gyn symptoms (abnormal  bleeding, pain),  syncope, focal weakness, memory loss, numbness & tingling, skin/hair/nail changes, abnormal bruising or bleeding, anxiety, or depression.   + 9 lb weight gain  This visit occurred during the SARS-CoV-2 public health emergency.  Safety protocols were in place, including screening questions prior to the visit, additional usage of staff PPE, and extensive cleaning of exam room while observing appropriate contact time as indicated for disinfecting solutions.       Objective:   Physical Exam General Appearance:    Alert, cooperative, no distress, appears stated age  Head:    Normocephalic, without obvious abnormality, atraumatic  Eyes:    PERRL, conjunctiva/corneas  clear, EOM's intact, fundi    benign, both eyes  Ears:    Normal TM's and external ear canals, both ears  Nose:   Nares normal, septum midline, mucosa normal, no drainage    or sinus tenderness  Throat:   Lips, mucosa, and tongue normal; teeth and gums normal  Neck:   Supple, symmetrical, trachea midline, no adenopathy;    Thyroid: diffuse enlargement w/o nodularity  Back:     Symmetric, no curvature, ROM normal, no CVA tenderness  Lungs:     Clear to auscultation bilaterally, respirations unlabored  Chest Wall:    No tenderness or deformity   Heart:    Regular rate and rhythm, S1 and S2 normal, no murmur, rub   or gallop  Breast Exam:    Deferred to GYN  Abdomen:     Soft, non-tender, bowel sounds active all four quadrants,    no masses, no organomegaly  Genitalia:    Deferred to GYN  Rectal:    Extremities:   Extremities normal, atraumatic, no cyanosis or edema  Pulses:   2+ and symmetric all extremities  Skin:   Skin color, texture, turgor normal, no rashes or lesions  Lymph nodes:   Cervical, supraclavicular, and axillary nodes normal  Neurologic:   CNII-XII intact, normal strength, sensation and reflexes    throughout          Assessment & Plan:

## 2020-08-28 NOTE — Assessment & Plan Note (Signed)
Pt has gained 9 lbs since last visit.  Stressed the need for healthy diet and regular exercise as well as taking thyroid medication as prescribed.  Check labs to risk stratify.  Will follow.

## 2020-10-01 ENCOUNTER — Other Ambulatory Visit: Payer: Self-pay | Admitting: Family Medicine

## 2020-11-02 ENCOUNTER — Encounter: Payer: Self-pay | Admitting: Family Medicine

## 2020-11-05 ENCOUNTER — Encounter: Payer: Self-pay | Admitting: Gastroenterology

## 2020-12-02 DIAGNOSIS — Z8616 Personal history of COVID-19: Secondary | ICD-10-CM

## 2020-12-02 HISTORY — DX: Personal history of COVID-19: Z86.16

## 2020-12-03 ENCOUNTER — Ambulatory Visit (AMBULATORY_SURGERY_CENTER): Payer: BC Managed Care – PPO

## 2020-12-03 ENCOUNTER — Other Ambulatory Visit: Payer: Self-pay

## 2020-12-03 VITALS — Ht 64.0 in | Wt 165.0 lb

## 2020-12-03 DIAGNOSIS — Z1211 Encounter for screening for malignant neoplasm of colon: Secondary | ICD-10-CM

## 2020-12-03 MED ORDER — PLENVU 140 G PO SOLR: 1.0000 | ORAL | 0 refills | Status: DC

## 2020-12-03 NOTE — Progress Notes (Signed)
Pre visit completed via phone;  Patient verified name, DOB, and address;  No egg or soy allergy known to patient  No issues with past sedation with any surgeries or procedures No intubation problems in the past  No FH of Malignant Hyperthermia No diet pills per patient No home 02 use per patient  No blood thinners per patient  Pt denies issues with constipation  No A fib or A flutter  EMMI video via MyChart  COVID 19 guidelines implemented in PV today with Pt and RN  Pt is fully vaccinated for Covid x 2 + booster= Pt denies loose or missing teeth, denies dentures, partials, dental implants, capped or bonded teeth Coupon given to pt in PV today , Code to Pharmacy and  NO PA's for preps discussed with pt in PV today  Due to the COVID-19 pandemic we are asking patients to follow certain guidelines.  Pt aware of COVID protocols and [facility] guidelines

## 2020-12-07 ENCOUNTER — Telehealth: Payer: Self-pay | Admitting: Family Medicine

## 2020-12-07 ENCOUNTER — Encounter: Payer: Self-pay | Admitting: Family Medicine

## 2020-12-07 DIAGNOSIS — U071 COVID-19: Secondary | ICD-10-CM

## 2020-12-07 NOTE — Telephone Encounter (Signed)
Spoke with patient about covid positive concerns. Patient states that she is doing all of the things that is stated by provider to do as well as using her albuterol inhaler and it is not helping  like she wants it to. I informed patient to go to ER or Urgent Care if her symptoms get any worse. Patient voiced understanding. No further concerns at this time.

## 2020-12-07 NOTE — Telephone Encounter (Signed)
Patient resulted positive after taking a rapid test yesterday and she states that she is not feeling well and she would like something prescribed. She has asthma as well.

## 2020-12-07 NOTE — Telephone Encounter (Signed)
Unfortunately there is not anything I can prescribe for this.  I can refer to the [place] ambulatory treatment team but given their treatment algorithm that uses age, vaccination status, and medical risk factors I doubt she will qualify for any of the limited treatments available.  She should take Vit D, Vit C, and Zinc to boost her immune system.  Treat her symptoms w/ tylenol/ibuprofen, OTC cough and cold meds, drink LOTS of fluids, and rest.

## 2020-12-07 NOTE — Addendum Note (Signed)
Addended by: Midge Minium on: 12/07/2020 01:35 PM   Modules accepted: Orders

## 2020-12-07 NOTE — Telephone Encounter (Signed)
Patient did the rapid test from [facility] for Covid yesterday and it came back Positive - States that she would like something prescribed for this.  She states that she has asthma and is feeling terrible.  Please advise

## 2020-12-08 ENCOUNTER — Other Ambulatory Visit (HOSPITAL_COMMUNITY): Payer: Self-pay | Admitting: Nurse Practitioner

## 2020-12-08 ENCOUNTER — Telehealth (HOSPITAL_COMMUNITY): Payer: Self-pay

## 2020-12-08 ENCOUNTER — Telehealth: Payer: Self-pay

## 2020-12-08 ENCOUNTER — Telehealth (HOSPITAL_COMMUNITY): Payer: BC Managed Care – PPO | Admitting: Nurse Practitioner

## 2020-12-08 DIAGNOSIS — E663 Overweight: Secondary | ICD-10-CM

## 2020-12-08 DIAGNOSIS — J4531 Mild persistent asthma with (acute) exacerbation: Secondary | ICD-10-CM

## 2020-12-08 DIAGNOSIS — U071 COVID-19: Secondary | ICD-10-CM

## 2020-12-08 MED ORDER — NIRMATRELVIR/RITONAVIR (PAXLOVID)TABLET: 3.0000 | ORAL_TABLET | Freq: Two times a day (BID) | ORAL | 0 refills | Status: AC

## 2020-12-08 MED FILL — PAXLOVID 20 X 150 MG & 10 X: 20 X 150 MG | 5 days supply | Qty: 30 | Fill #0

## 2020-12-08 NOTE — Telephone Encounter (Signed)
Patient was prescribed oral covid treatment Paxlovid and treatment note was reviewed. Medication has been received by [facility] and reviewed for appropriateness.  Drug Interactions or Dosage Adjustments Noted: hold flonase and decrease symbicort  Delivery Method: pick up  Patient contacted for counseling on 12/08/20 and verbalized understanding.   Delivery or Pick-Up Date: 12/08/20   Alinda Dooms 12/08/2020, 11:40 AM [facility] Outpatient Pharmacist Phone# [phone]

## 2020-12-08 NOTE — Telephone Encounter (Signed)
Called to discuss with patient about COVID-19 symptoms and the use of one of the available treatments for those with mild to moderate Covid symptoms and at a high risk of hospitalization.  Pt appears to qualify for outpatient treatment due to co-morbid conditions and/or a member of an at-risk group in accordance with the FDA Emergency Use Authorization.    Symptom onset: 12/04/20 cough,body aches, weakness Vaccinated: Yes Booster? Yes Immunocompromised? No Qualifiers: Asthma  Pt. Would like to speak with APP.  Marcello Moores

## 2020-12-08 NOTE — Telephone Encounter (Signed)
Outpatient Oral COVID Treatment Note  I connected with Sheryl Nichols on 12/08/2020/11:04 AM by telephone and verified that I am speaking with the correct person using two identifiers.  I discussed the limitations, risks, security, and privacy concerns of performing an evaluation and management service by telephone and the availability of in person appointments. I also discussed with the patient that there may be a patient responsible charge related to this service. The patient expressed understanding and agreed to proceed.  Patient location: at home Provider location: home office  Diagnosis: COVID-19 infection  Purpose of visit: Discussion of potential use of Molnupiravir or Paxlovid, a new treatment for mild to moderate COVID-19 viral infection in non-hospitalized patients.   Subjective: Patient is a 50 y.o. female who has been diagnosed with COVID 19 viral infection.  Their symptoms began on 12/04/2020 with cough, congestion, fatigue, body aches.    Past Medical History:  Diagnosis Date  . Allergy    seasonal allergies  . Asthma    uses inhaler  . Hypothyroidism    hx of  . Migraines    resolved off sugar    Allergies  Allergen Reactions  . Codeine     REACTION: tingling all over; / nausea & vomiting     Current Outpatient Medications:  .  nirmatrelvir/ritonavir EUA (PAXLOVID) TABS, Take 3 tablets by mouth 2 (two) times daily for 5 days. Take nirmatrelvir (150 mg) 3 tablet(s) twice daily for 5 days and ritonavir (100 mg) one tablet twice daily for 5 days., Disp: 30 tablet, Rfl: 0 .  beclomethasone (QVAR REDIHALER) 80 MCG/ACT inhaler, Inhale 2 puffs into the lungs 2 (two) times daily., Disp: 10.6 g, Rfl: 3 .  cetirizine (ZYRTEC) 10 MG tablet, Take 1 tablet (10 mg total) by mouth daily., Disp: 30 tablet, Rfl: 11 .  fluticasone (FLONASE) 50 MCG/ACT nasal spray, Place 2 sprays into both nostrils daily., Disp: 16 g, Rfl: 11 .  PEG-KCl-NaCl-NaSulf-Na Asc-C (PLENVU) 140 g SOLR,  Take 1 kit by mouth as directed. Manufacturer's coupon Universal coupon code:BIN: P2366821; GROUP: [id number]; PCN: [id number]; ID: [id number]; PAY NO MORE $50; NO prior authorization, Disp: 1 each, Rfl: 0 .  VENTOLIN HFA 108 (90 Base) MCG/ACT inhaler, INHALE 1 TO 2 PUFFS BY MOUTH INTO THE LUNGS EVERY 4 TO 6 HOURS AS NEEDED FOR WHEEZING OR SHORTNESS OF BREATH, Disp: 18 g, Rfl: 1  Objective: Patient appears/sounds congested, frequent cough.  They are in no apparent distress.  Breathing is non labored.  Mood and behavior are normal.  Laboratory Data:  No results found for this or any previous visit (from the past 2160 hour(s)).   Assessment: 50 y.o. female with mild/moderate COVID 19 viral infection diagnosed on 12/07/2020 at high risk for progression to severe COVID 19.  Plan:  This patient is a 50 y.o. female that meets the following criteria for Emergency Use Authorization of: Paxlovid 1. Age >12 yr AND > 40 kg 2. SARS-COV-2 positive test 3. Symptom onset < 5 days 4. Mild-to-moderate COVID disease with high risk for severe progression to hospitalization or death  I have spoken and communicated the following to the patient or parent/caregiver regarding: 1. Paxlovid is an unapproved drug that is authorized for use under an Emergency Use Authorization.  2. There are no adequate, approved, available products for the treatment of COVID-19 in adults who have mild-to-moderate COVID-19 and are at high risk for progressing to severe COVID-19, including hospitalization or death. 3. Other therapeutics are currently authorized. For additional information  on all products authorized for treatment or prevention of COVID-19, please see [email or web address].  4. There are benefits and risks of taking this treatment as outlined in the "Fact Sheet for Patients and Caregivers."  5. "Fact Sheet for Patients and  Caregivers" was reviewed with patient. A hard copy will be provided to patient from pharmacy prior to the patient receiving treatment. 6. Patients should continue to self-isolate and use infection control measures (e.g., wear mask, isolate, social distance, avoid sharing personal items, clean and disinfect "high touch" surfaces, and frequent handwashing) according to CDC guidelines.  7. The patient or parent/caregiver has the option to accept or refuse treatment. 8. Patient medication history was reviewed for potential drug interactions:Interaction with home meds: Flonase- increased absorption of fluticasone.  9. Patient's GFR was calculated to be 106, and they were therefore prescribed Normal dose (GFR>60) - nirmatrelvir $RemoveBefore'150mg'ZsDjyJkZsrRBn$  tab (2 tablet) by mouth twice daily AND ritonavir $RemoveBefor'100mg'VYiMKEJJsXIE$  tab (1 tablet) by mouth twice daily   After reviewing above information with the patient, the patient agrees to receive Paxlovid.  Follow up instructions:    . Take prescription BID x 5 days as directed . Reach out to pharmacist for counseling on medication if desired . For concerns regarding further COVID symptoms please follow up with your PCP or urgent care . For urgent or life-threatening issues, seek care at your local emergency department  The patient was provided an opportunity to ask questions, and all were answered. The patient agreed with the plan and demonstrated an understanding of the instructions.   Script sent to [facility] and opted to pick up RX.  The patient was advised to call their PCP or seek an in-person evaluation if the symptoms worsen or if the condition fails to improve as anticipated.   I provided 30 minutes of non face-to-face telephone visit time during this encounter, and > 50% was spent counseling as documented under my assessment & plan.  Orma Render, NP 12/08/2020 /11:04 AM

## 2020-12-16 ENCOUNTER — Encounter: Payer: Self-pay | Admitting: Gastroenterology

## 2020-12-21 ENCOUNTER — Ambulatory Visit (AMBULATORY_SURGERY_CENTER): Payer: BC Managed Care – PPO | Admitting: Gastroenterology

## 2020-12-21 ENCOUNTER — Other Ambulatory Visit: Payer: Self-pay

## 2020-12-21 ENCOUNTER — Encounter: Payer: Self-pay | Admitting: Gastroenterology

## 2020-12-21 VITALS — BP 136/74 | HR 87 | Temp 98.6°F | Resp 19 | Ht 64.0 in | Wt 165.0 lb

## 2020-12-21 DIAGNOSIS — Z1211 Encounter for screening for malignant neoplasm of colon: Secondary | ICD-10-CM | POA: Diagnosis not present

## 2020-12-21 MED ORDER — SODIUM CHLORIDE 0.9 % IV SOLN: 500.0000 mL | Freq: Once | INTRAVENOUS | Status: DC

## 2020-12-21 NOTE — Op Note (Signed)
[place] Patient Name: Sheryl Nichols Procedure Date: 12/21/2020 1:29 PM MRN: [id number] Endoscopist: Milus Banister , MD Age: 50 Referring MD:  Date of Birth: 1971/05/11 Gender: Female Account #: [id number] Procedure:                Colonoscopy Indications:              Screening for colorectal malignant neoplasm Medicines:                Monitored Anesthesia Care Procedure:                Pre-Anesthesia Assessment:                           - Prior to the procedure, a History and Physical                            was performed, and patient medications and                            allergies were reviewed. The patient's tolerance of                            previous anesthesia was also reviewed. The risks                            and benefits of the procedure and the sedation                            options and risks were discussed with the patient.                            All questions were answered, and informed consent                            was obtained. Prior Anticoagulants: The patient has                            taken no previous anticoagulant or antiplatelet                            agents. ASA Grade Assessment: II - A patient with                            mild systemic disease. After reviewing the risks                            and benefits, the patient was deemed in                            satisfactory condition to undergo the procedure.                           After obtaining informed consent, the colonoscope  was passed under direct vision. Throughout the                            procedure, the patient's blood pressure, pulse, and                            oxygen saturations were monitored continuously. The                            Colonoscope was introduced through the anus and                            advanced to the the cecum, identified by                            appendiceal orifice  and ileocecal valve. The                            colonoscopy was performed without difficulty. The                            patient tolerated the procedure well. The quality                            of the bowel preparation was good. The ileocecal                            valve, appendiceal orifice, and rectum were                            photographed. Scope In: 1:35:38 PM Scope Out: 1:46:40 PM Scope Withdrawal Time: 0 hours 8 minutes 44 seconds  Total Procedure Duration: 0 hours 11 minutes 2 seconds  Findings:                 The entire examined colon appeared normal on direct                            and retroflexion views. Complications:            No immediate complications. Estimated blood loss:                            None. Estimated Blood Loss:     Estimated blood loss: none. Impression:               - The entire examined colon is normal on direct and                            retroflexion views.                           - No polyps or cancers. Recommendation:           - Patient has a contact number available for  emergencies. The signs and symptoms of potential                            delayed complications were discussed with the                            patient. Return to normal activities tomorrow.                            Written discharge instructions were provided to the                            patient.                           - Resume previous diet.                           - Continue present medications.                           - Repeat colonoscopy in 10 years for screening. Milus Banister, MD 12/21/2020 2:13:59 PM This report has been signed electronically.

## 2020-12-21 NOTE — Progress Notes (Signed)
Vitals by CW 

## 2020-12-21 NOTE — Progress Notes (Signed)
PT taken to PACU. Monitors in place. VSS. Report given to RN. 

## 2020-12-21 NOTE — Patient Instructions (Signed)
YOU HAD AN ENDOSCOPIC PROCEDURE TODAY AT THE [facility]:   Refer to the procedure report that was given to you for any specific questions about what was found during the examination.  If the procedure report does not answer your questions, please call your gastroenterologist to clarify.  If you requested that your care partner not be given the details of your procedure findings, then the procedure report has been included in a sealed envelope for you to review at your convenience later.  YOU SHOULD EXPECT: Some feelings of bloating in the abdomen. Passage of more gas than usual.  Walking can help get rid of the air that was put into your GI tract during the procedure and reduce the bloating. If you had a lower endoscopy (such as a colonoscopy or flexible sigmoidoscopy) you may notice spotting of blood in your stool or on the toilet paper. If you underwent a bowel prep for your procedure, you may not have a normal bowel movement for a few days.  Please Note:  You might notice some irritation and congestion in your nose or some drainage.  This is from the oxygen used during your procedure.  There is no need for concern and it should clear up in a day or so.  SYMPTOMS TO REPORT IMMEDIATELY:   Following lower endoscopy (colonoscopy or flexible sigmoidoscopy):  Excessive amounts of blood in the stool  Significant tenderness or worsening of abdominal pains  Swelling of the abdomen that is new, acute  Fever of 100F or higher  For urgent or emergent issues, a gastroenterologist can be reached at any hour by calling [phone]. Do not use MyChart messaging for urgent concerns.    DIET:  We do recommend a small meal at first, but then you may proceed to your regular diet.  Drink plenty of fluids but you should avoid alcoholic beverages for 24 hours.  ACTIVITY:  You should plan to take it easy for the rest of today and you should NOT DRIVE or use heavy machinery until tomorrow (because  of the sedation medicines used during the test).    FOLLOW UP: Our staff will call the number listed on your records 48-72 hours following your procedure to check on you and address any questions or concerns that you may have regarding the information given to you following your procedure. If we do not reach you, we will leave a message.  We will attempt to reach you two times.  During this call, we will ask if you have developed any symptoms of COVID 19. If you develop any symptoms (ie: fever, flu-like symptoms, shortness of breath, cough etc.) before then, please call [phone].  If you test positive for Covid 19 in the 2 weeks post procedure, please call and report this information to us.    If any biopsies were taken you will be contacted by phone or by letter within the next 1-3 weeks.  Please call us at [phone] if you have not heard about the biopsies in 3 weeks.    SIGNATURES/CONFIDENTIALITY: You and/or your care partner have signed paperwork which will be entered into your electronic medical record.  These signatures attest to the fact that that the information above on your After Visit Summary has been reviewed and is understood.  Full responsibility of the confidentiality of this discharge information lies with you and/or your care-partner. 

## 2020-12-23 ENCOUNTER — Telehealth: Payer: Self-pay

## 2020-12-23 NOTE — Telephone Encounter (Signed)
LVM

## 2020-12-29 ENCOUNTER — Other Ambulatory Visit: Payer: Self-pay | Admitting: Emergency Medicine

## 2020-12-29 DIAGNOSIS — J310 Chronic rhinitis: Secondary | ICD-10-CM

## 2020-12-29 MED ORDER — FLUTICASONE PROPIONATE 50 MCG/ACT NA SUSP: 2.0000 | Freq: Every day | NASAL | 3 refills | Status: DC

## 2021-02-10 ENCOUNTER — Encounter: Payer: Self-pay | Admitting: Family Medicine

## 2021-02-10 ENCOUNTER — Ambulatory Visit (INDEPENDENT_AMBULATORY_CARE_PROVIDER_SITE_OTHER): Payer: BC Managed Care – PPO | Admitting: Family Medicine

## 2021-02-10 ENCOUNTER — Other Ambulatory Visit: Payer: Self-pay

## 2021-02-10 VITALS — BP 124/80 | HR 82 | Temp 98.0°F | Resp 19 | Ht 64.0 in | Wt 164.6 lb

## 2021-02-10 DIAGNOSIS — R413 Other amnesia: Secondary | ICD-10-CM | POA: Diagnosis not present

## 2021-02-10 NOTE — Progress Notes (Signed)
   Subjective:    Patient ID: Sheryl Nichols, female    DOB: [birth year], 50 y.o.   MRN: [id number]  HPI Memory loss- 'she just keeps forgetting things all the time' (daughter [place]).  Pt is forgetting conversations and information that she was told.  Not forgetting names or uses of objects.  No difficulty remembering people.  + difficulty remembering what she read.  + difficulty w/ word retrieval but daughter says this is not new.  Pt reports sxs started after COVID in Feb.  Daughter feels like sxs are worsening rather than improving.  Pt bought plane tickets today for the wrong dates.  She is very frustrated.   Review of Systems For ROS see HPI   This visit occurred during the SARS-CoV-2 public health emergency.  Safety protocols were in place, including screening questions prior to the visit, additional usage of staff PPE, and extensive cleaning of exam room while observing appropriate contact time as indicated for disinfecting solutions.       Objective:   Physical Exam Vitals reviewed.  Constitutional:      General: She is not in acute distress.    Appearance: Normal appearance. She is not ill-appearing.  HENT:     Head: Normocephalic and atraumatic.  Eyes:     Extraocular Movements: Extraocular movements intact.     Conjunctiva/sclera: Conjunctivae normal.     Pupils: Pupils are equal, round, and reactive to light.  Skin:    General: Skin is warm and dry.  Neurological:     General: No focal deficit present.     Mental Status: She is alert and oriented to person, place, and time.     Cranial Nerves: No cranial nerve deficit.     Motor: No weakness.     Coordination: Coordination normal.     Gait: Gait normal.  Psychiatric:        Mood and Affect: Mood normal.        Behavior: Behavior normal.        Thought Content: Thought content normal.           Assessment & Plan:  Memory loss- new since COVID in February.  Pt reports she showed some of the same signs  before she got sick but was very clear that those were bc she was busy/distracted/not paying attention.  Now she finds her sxs very frustrating and concerning.  Daughter is very concerned and participated in today's visit via speaker phone.  She doesn't remember conversations or instructions that were provided, she has a hard time understanding or remembering what she read, and her word finding difficulty is worse than it's ever been.  Discussed that this is a possible after effect of COVID infection and we will refer for Neuropsych testing for both a baseline and hopefully a plan going forward.  In the meantime, I encouraged her to give herself some grace and we discussed note taking and setting reminders as tools to help her.  Pt expressed understanding and is in agreement w/ plan.

## 2021-02-10 NOTE — Patient Instructions (Signed)
Follow up as needed or as scheduled Give yourself some grace.  This is incredibly frustrating but it worsens the more upset you get They will call you with the Neuropsych testing availability Leave yourself notes and reminders Call with any questions or concerns Hang in there!!!

## 2021-02-17 ENCOUNTER — Encounter: Payer: Self-pay | Admitting: Psychology

## 2021-02-17 ENCOUNTER — Other Ambulatory Visit: Payer: Self-pay

## 2021-02-17 ENCOUNTER — Emergency Department (HOSPITAL_BASED_OUTPATIENT_CLINIC_OR_DEPARTMENT_OTHER)
Admission: EM | Admit: 2021-02-17 | Discharge: 2021-02-17 | Disposition: A | Discharge status: Home / Self Care | Payer: BC Managed Care – PPO | Attending: Emergency Medicine | Admitting: Emergency Medicine

## 2021-02-17 DIAGNOSIS — M5441 Lumbago with sciatica, right side: Secondary | ICD-10-CM | POA: Insufficient documentation

## 2021-02-17 DIAGNOSIS — Z7951 Long term (current) use of inhaled steroids: Secondary | ICD-10-CM | POA: Diagnosis not present

## 2021-02-17 DIAGNOSIS — M5442 Lumbago with sciatica, left side: Secondary | ICD-10-CM | POA: Insufficient documentation

## 2021-02-17 DIAGNOSIS — M545 Low back pain, unspecified: Secondary | ICD-10-CM | POA: Diagnosis present

## 2021-02-17 DIAGNOSIS — J45909 Unspecified asthma, uncomplicated: Secondary | ICD-10-CM | POA: Insufficient documentation

## 2021-02-17 DIAGNOSIS — E039 Hypothyroidism, unspecified: Secondary | ICD-10-CM | POA: Insufficient documentation

## 2021-02-17 MED ORDER — CYCLOBENZAPRINE HCL 10 MG PO TABS: 10.0000 mg | ORAL_TABLET | Freq: Three times a day (TID) | ORAL | 0 refills | Status: DC | PRN

## 2021-02-17 MED ORDER — HYDROMORPHONE HCL 1 MG/ML IJ SOLN
2.0000 mg | Freq: Once | INTRAMUSCULAR | Status: AC
Start: 2021-02-17 — End: 2021-02-17
  Administered 2021-02-17: 2 mg via INTRAMUSCULAR
  Filled 2021-02-17: qty 2

## 2021-02-17 MED ORDER — HYDROMORPHONE HCL 4 MG PO TABS: 4.0000 mg | ORAL_TABLET | ORAL | 0 refills | Status: DC | PRN

## 2021-02-17 MED ORDER — KETOROLAC TROMETHAMINE 30 MG/ML IJ SOLN
30.0000 mg | Freq: Once | INTRAMUSCULAR | Status: AC
  Administered 2021-02-17: 30 mg via INTRAMUSCULAR
  Filled 2021-02-17: qty 1

## 2021-02-17 NOTE — ED Triage Notes (Addendum)
Lower middle radiating -right knee to toes-  back pain Was gardening last week and noted pain a few days later. Has tried tylenol and a muscle relaxer  Weakness in right leg

## 2021-02-17 NOTE — ED Provider Notes (Signed)
[place] EMERGENCY DEPARTMENT Provider Note   CSN: [id number] Arrival date & time: 02/17/21  1025     History Chief Complaint  Patient presents with  . Back Pain    Sheryl Nichols is a 50 y.o. female.  HPI 50 year old female presents with severe back pain.  Originally started on 4/23 when she got into her friend's large truck and twisted and felt pain.  Since then it got worse yesterday when she did physical therapy for her knees.  Had severe pain last night.  Went to the bathroom this morning and fell to the ground because her right leg gave out on her.  She has had weakness and pain in this leg.  The pain is across her diffuse low back and radiates down both legs to the knees but her right leg down to her toes anteriorly.  No numbness in the legs right now.  No incontinence or direct trauma to her back.  She has tried Tylenol and a leftover muscle relaxer and some Unisom with minimal relief.  Pain is significant at rest but severe with any type of movement. No saddle anesthesia.   Past Medical History:  Diagnosis Date  . Allergy    seasonal allergies  . Asthma    uses inhaler  . Hypothyroidism    hx of  . Migraines    resolved off sugar    Patient Active Problem List   Diagnosis Date Noted  . Overweight (BMI 25.0-29.9) 08/28/2020  . Family history of early CAD 04/13/2020  . Recurrent UTI 08/22/2019  . Physical exam 06/08/2018  . Hyperhidrosis 02/21/2014  . CTS (carpal tunnel syndrome) 06/04/2013  . DERMATOGRAPHIA 09/06/2010  . RHINITIS 09/13/2009  . Asthma 08/22/2009  . SLEEP DISORDER, CHRONIC 08/21/2009  . Hypothyroidism 07/31/2007  . ANEMIA-NOS 07/31/2007  . CARDIAC MURMUR 07/31/2007    Past Surgical History:  Procedure Laterality Date  . BREAST ENHANCEMENT SURGERY    . COLONOSCOPY  2009   for rectal bleeding; IH found  . SEPTOPLASTY     sinus cystectomy  . TONSILLECTOMY AND ADENOIDECTOMY    . TYMPANOPLASTY    . WISDOM TOOTH EXTRACTION        OB History   No obstetric history on file.     Family History  Problem Relation Age of Onset  . Hyperlipidemia Mother   . Hypertension Mother   . Heart attack Mother 12  . Hypothyroidism Mother   . Cancer Maternal Grandfather        Brain Cancer  . Diabetes Neg Hx   . Stroke Neg Hx   . Stomach cancer Neg Hx   . Rectal cancer Neg Hx   . Esophageal cancer Neg Hx   . Colon cancer Neg Hx   . Colon polyps Neg Hx     Social History   Tobacco Use  . Smoking status: Never Smoker  . Smokeless tobacco: Never Used  Vaping Use  . Vaping Use: Never used  Substance Use Topics  . Alcohol use: Not Currently  . Drug use: No    Home Medications Prior to Admission medications   Medication Sig Start Date End Date Taking? Authorizing Provider  cyclobenzaprine (FLEXERIL) 10 MG tablet Take 1 tablet (10 mg total) by mouth 3 (three) times daily as needed for muscle spasms. 02/17/21  Yes Sherwood Gambler, MD  HYDROmorphone (DILAUDID) 4 MG tablet Take 1 tablet (4 mg total) by mouth every 4 (four) hours as needed for severe pain. 02/17/21  Yes  Sherwood Gambler, MD  beclomethasone (QVAR REDIHALER) 80 MCG/ACT inhaler Inhale 2 puffs into the lungs 2 (two) times daily. 08/28/20   Midge Minium, MD  cetirizine (ZYRTEC) 10 MG tablet Take 1 tablet (10 mg total) by mouth daily. 08/28/20   Midge Minium, MD  fluticasone (FLONASE) 50 MCG/ACT nasal spray Place 2 sprays into both nostrils daily. 12/29/20   Midge Minium, MD  ID NOW COVID-19 KIT TEST AS DIRECTED TODAY 12/20/20   [provider]  Nirmatrelvir & Ritonavir 20 x 150 MG & 10 x $Re'100MG'aDB$  TBPK TAKE 3 TABLETS BY MOUTH TWICE DAILY FOR 5 DAYS Patient not taking: Reported on 02/10/2021 12/08/20 12/08/21  Early, Coralee Pesa, NP  VENTOLIN HFA 108 (90 Base) MCG/ACT inhaler INHALE 1 TO 2 PUFFS BY MOUTH INTO THE LUNGS EVERY 4 TO 6 HOURS AS NEEDED FOR WHEEZING OR SHORTNESS OF BREATH 07/02/20   Midge Minium, MD    Allergies     Codeine  Review of Systems   Review of Systems  Gastrointestinal: Negative for abdominal pain.  Genitourinary: Negative for dysuria.  Musculoskeletal: Positive for back pain.  Neurological: Positive for weakness. Negative for numbness.  All other systems reviewed and are negative.   Physical Exam Updated Vital Signs BP 133/72   Pulse 77   Temp 98 F (36.7 C) (Oral)   Resp 18   Ht $R'5\' 4"'AC$  (1.626 m)   Wt 75.8 kg   SpO2 99%   BMI 28.67 kg/m   Physical Exam Vitals and nursing note reviewed.  Constitutional:      Appearance: She is well-developed.  HENT:     Head: Normocephalic and atraumatic.     Right Ear: External ear normal.     Left Ear: External ear normal.     Nose: Nose normal.  Eyes:     General:        Right eye: No discharge.        Left eye: No discharge.  Cardiovascular:     Rate and Rhythm: Normal rate and regular rhythm.     Heart sounds: Normal heart sounds.  Pulmonary:     Effort: Pulmonary effort is normal.     Breath sounds: Normal breath sounds.  Abdominal:     General: There is no distension.     Palpations: Abdomen is soft.     Tenderness: There is no abdominal tenderness.  Musculoskeletal:     Thoracic back: No tenderness or bony tenderness.     Lumbar back: No tenderness or bony tenderness.  Skin:    General: Skin is warm and dry.  Neurological:     Mental Status: She is alert.     Deep Tendon Reflexes: Babinski sign absent on the right side. Babinski sign absent on the left side.     Reflex Scores:      Patellar reflexes are 2+ on the right side and 2+ on the left side.      Achilles reflexes are 2+ on the right side and 2+ on the left side.    Comments: 5/5 strength in BLE. Is painful with RLE but no appreciable weakness. Grossly normal sensation  Psychiatric:        Mood and Affect: Mood is not anxious.     ED Results / Procedures / Treatments   Labs (all labs ordered are listed, but only abnormal results are displayed) Labs  Reviewed - No data to display  EKG None  Radiology No results found.  Procedures  Procedures   Medications Ordered in ED Medications  HYDROmorphone (DILAUDID) injection 2 mg (2 mg Intramuscular Given 02/17/21 1117)  ketorolac (TORADOL) 30 MG/ML injection 30 mg (30 mg Intramuscular Given 02/17/21 1116)    ED Course  I have reviewed the triage vital signs and the nursing notes.  Pertinent labs & imaging results that were available during my care of the patient were reviewed by me and considered in my medical decision making (see chart for details).    MDM Rules/Calculators/A&P                          I think the patient's "weakness" is more pain related than true neurologic weakness.  After IM Dilaudid and Toradol she is able to ambulate without difficulty.  No weakness appreciated on exam.  Given this I do not think she has any emergent neurologic findings that would warrant an emergency MRI though she will probably need an MRI as an outpatient.  She has follow-up with Dr. Rolena Infante later this week.  We will give oral pain control.  Discussed return precautions.  Likely this is sciatica. Final Clinical Impression(s) / ED Diagnoses Final diagnoses:  Acute bilateral low back pain with bilateral sciatica    Rx / DC Orders ED Discharge Orders         Ordered    HYDROmorphone (DILAUDID) 4 MG tablet  Every 4 hours PRN        02/17/21 1217    cyclobenzaprine (FLEXERIL) 10 MG tablet  3 times daily PRN        02/17/21 1217           Sherwood Gambler, MD 02/17/21 1458

## 2021-02-17 NOTE — ED Notes (Signed)
Warm blanket and socks applied to pt - has call bell

## 2021-02-17 NOTE — Discharge Instructions (Addendum)
If you develop worsening, recurrent, or continued back pain, numbness or weakness in the legs, incontinence of your bowels or bladders, numbness of your buttocks, fever, abdominal pain, or any other new/concerning symptoms then return to the ER for evaluation.  

## 2021-02-19 DIAGNOSIS — M5416 Radiculopathy, lumbar region: Secondary | ICD-10-CM

## 2021-02-19 HISTORY — DX: Radiculopathy, lumbar region: M54.16

## 2021-04-12 ENCOUNTER — Other Ambulatory Visit: Payer: Self-pay | Admitting: Family Medicine

## 2021-04-12 DIAGNOSIS — J310 Chronic rhinitis: Secondary | ICD-10-CM

## 2021-04-21 ENCOUNTER — Encounter: Payer: Self-pay | Admitting: *Deleted

## 2021-04-22 ENCOUNTER — Encounter: Payer: Self-pay | Admitting: Psychology

## 2021-04-22 ENCOUNTER — Ambulatory Visit (INDEPENDENT_AMBULATORY_CARE_PROVIDER_SITE_OTHER): Payer: BC Managed Care – PPO | Admitting: Psychology

## 2021-04-22 ENCOUNTER — Ambulatory Visit: Payer: BC Managed Care – PPO | Admitting: Psychology

## 2021-04-22 ENCOUNTER — Other Ambulatory Visit: Payer: Self-pay

## 2021-04-22 ENCOUNTER — Other Ambulatory Visit: Payer: Self-pay | Admitting: Family Medicine

## 2021-04-22 DIAGNOSIS — J4531 Mild persistent asthma with (acute) exacerbation: Secondary | ICD-10-CM

## 2021-04-22 DIAGNOSIS — R41840 Attention and concentration deficit: Secondary | ICD-10-CM

## 2021-04-22 DIAGNOSIS — R4189 Other symptoms and signs involving cognitive functions and awareness: Secondary | ICD-10-CM

## 2021-04-22 DIAGNOSIS — Z8616 Personal history of COVID-19: Secondary | ICD-10-CM

## 2021-04-22 DIAGNOSIS — F902 Attention-deficit hyperactivity disorder, combined type: Secondary | ICD-10-CM | POA: Insufficient documentation

## 2021-04-22 HISTORY — DX: Attention-deficit hyperactivity disorder, combined type: F90.2

## 2021-04-22 MED ORDER — QVAR REDIHALER 80 MCG/ACT IN AERB: 2.0000 | INHALATION_SPRAY | Freq: Two times a day (BID) | RESPIRATORY_TRACT | 3 refills | Status: DC

## 2021-04-22 NOTE — Progress Notes (Signed)
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      Raw Score Percentile   Clock Drawing: 10/10 --- Within Normal Limits       NAB Spatial Module, Form 1: T Score Percentile     Figure Drawing Copy 69 97 Well Above Average       Mood and Personality:      Raw Score Percentile   Beck Depression Inventory - II: 8 --- Within Normal Limits  PROMIS Anxiety Questionnaire: 12 --- None to Slight       Additional Questionnaires:      Raw Score Percentile   Adult Self-Report Scale (Current):       Inattention 27 --- Highly likely to have ADHD    Hyperactive/Impulsive 29 --- Highly likely to have ADHD  Adult Self-Report Scale (Childhood):       Inattention 31 --- Highly  likely to have ADHD    Hyperactive/Impulsive 35 --- Highly likely to have ADHD       PROMIS Sleep Disturbance Questionnaire: 17 --- None to Slight   Informed Consent and Coding/Compliance:   The current evaluation represents a clinical evaluation for the purposes previously outlined by the referral source and is in no way reflective of a forensic evaluation.   Ms. Lippold was  provided with a verbal description of the nature and purpose of the present neuropsychological evaluation. Also reviewed were the foreseeable risks and/or discomforts and benefits of the procedure, limits of confidentiality, and mandatory reporting requirements of this provider. The patient was given the opportunity to ask questions and receive answers about the evaluation. Oral consent to participate was provided by the patient.   This evaluation was conducted by Christia Reading, Ph.D., licensed clinical neuropsychologist. Ms. Don completed a clinical interview with Dr. Melvyn Novas, billed as one unit [phone], and 190 minutes of cognitive testing and scoring, billed as one unit [phone] and five additional units 96139. Psychometrist Milana Kidney, B.S., assisted Dr. Melvyn Novas with test administration and scoring procedures. As a separate and discrete service, Dr. Melvyn Novas spent a total of 160 minutes in interpretation and report writing billed as one unit [phone] and two units 96133.

## 2021-04-22 NOTE — Progress Notes (Signed)
   Psychometrician Note   Cognitive testing was administered to [name] by Milana Kidney, B.S. (psychometrist) under the supervision of Dr. Christia Reading, Ph.D., licensed psychologist on 04/22/2021. Sheryl Nichols did not appear overtly distressed by the testing session per behavioral observation or responses across self-report questionnaires. Rest breaks were offered.    The battery of tests administered was selected by Dr. Christia Reading, Ph.D. with consideration to Sheryl Nichols's current level of functioning, the nature of her symptoms, emotional and behavioral responses during interview, level of literacy, observed level of motivation/effort, and the nature of the referral question. This battery was communicated to the psychometrist. Communication between Dr. Christia Reading, Ph.D. and the psychometrist was ongoing throughout the evaluation and Dr. Christia Reading, Ph.D. was immediately accessible at all times. Dr. Christia Reading, Ph.D. provided supervision to the psychometrist on the date of this service to the extent necessary to assure the quality of all services provided.    Sheryl Nichols will return within approximately 1-2 weeks for an interactive feedback session with Dr. Melvyn Novas at which time her test performances, clinical impressions, and treatment recommendations will be reviewed in detail. Sheryl Nichols understands she can contact our office should she require our assistance before this time.  A total of 190 minutes of billable time were spent face-to-face with Sheryl Nichols by the psychometrist. This includes both test administration and scoring time. Billing for these services is reflected in the clinical report generated by Dr. Christia Reading, Ph.D.  This note reflects time spent with the psychometrician and does not include test scores or any clinical interpretations made by Dr. Melvyn Novas. The full report will follow in a separate note.

## 2021-04-22 NOTE — Progress Notes (Signed)
Prescription sent electronically per pt request

## 2021-05-04 ENCOUNTER — Ambulatory Visit (INDEPENDENT_AMBULATORY_CARE_PROVIDER_SITE_OTHER): Payer: BC Managed Care – PPO | Admitting: Family Medicine

## 2021-05-04 ENCOUNTER — Encounter: Payer: Self-pay | Admitting: Family Medicine

## 2021-05-04 ENCOUNTER — Other Ambulatory Visit: Payer: Self-pay

## 2021-05-04 VITALS — BP 120/80 | HR 97 | Temp 98.3°F | Resp 20 | Ht 64.0 in | Wt 155.2 lb

## 2021-05-04 DIAGNOSIS — F430 Acute stress reaction: Secondary | ICD-10-CM

## 2021-05-04 DIAGNOSIS — M25511 Pain in right shoulder: Secondary | ICD-10-CM | POA: Diagnosis not present

## 2021-05-04 DIAGNOSIS — M542 Cervicalgia: Secondary | ICD-10-CM

## 2021-05-04 DIAGNOSIS — S40812A Abrasion of left upper arm, initial encounter: Secondary | ICD-10-CM | POA: Diagnosis not present

## 2021-05-04 MED ORDER — PREDNISONE 10 MG PO TABS: ORAL_TABLET | ORAL | 0 refills | Status: DC

## 2021-05-04 MED ORDER — METHOCARBAMOL 500 MG PO TABS: 500.0000 mg | ORAL_TABLET | Freq: Three times a day (TID) | ORAL | 0 refills | Status: DC | PRN
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
## 2021-05-04 NOTE — Patient Instructions (Signed)
Follow up as needed or as scheduled START the Prednisone as directed- take w/ food AVOID ibuprofen, motrin, excedrin while on the Prednisone USE the Methocarbamol as needed for pain/spasm HEAT!!! We'll call you with your counseling referral but in the meantime, call your current therapist to schedule an appt IF no improvement with Prednisone or Methocarbamol or time, let me know and we'll refer to Ortho Call with any questions or concerns Hang in there!!!

## 2021-05-05 ENCOUNTER — Ambulatory Visit (INDEPENDENT_AMBULATORY_CARE_PROVIDER_SITE_OTHER): Payer: BC Managed Care – PPO | Admitting: Psychology

## 2021-05-05 DIAGNOSIS — R41840 Attention and concentration deficit: Secondary | ICD-10-CM

## 2021-05-05 NOTE — Progress Notes (Signed)
   Neuropsychology Feedback Session Tillie Rung. [place] [facility]  Reason for Referral:   Sheryl Nichols is a 50 y.o. right-handed Caucasian female referred by  [name], M.D. , to characterize her current cognitive functioning and assist with diagnostic clarity and treatment planning in the context of subjective cognitive dysfunction following COVID-19 infection.  Feedback:   Sheryl Nichols completed a comprehensive neuropsychological evaluation on 04/22/2021. Please refer to that encounter for the full report and recommendations. Briefly, results suggested neuropsychological functioning within normal limits. All assessed cognitive domains demonstrated appropriate and often very strong performances. Sheryl Nichols reported a belief that she may have undiagnosed attention deficit hyperactivity disorder (ADHD) during interview. Sheryl Nichols reported longstanding and persisting difficulties surrounding sustained attention and distractibility dating back to early childhood/adolescence. On a continuous performance test, results suggested the "moderate likelihood" of her having a disorder characterized by attentional deficits such as ADHD. She also scored in the "highly likely to have ADHD" range when describing childhood symptoms of both inattention and hyperactivity/impulsivity. Taken altogether, I find it quite reasonable that Sheryl Nichols has this condition.   Sheryl Nichols was unaccompanied during the current feedback session. Content of the current session focused on the results of her neuropsychological evaluation. Sheryl Nichols was given the opportunity to ask questions and her questions were answered. She was encouraged to reach out should additional questions arise. A copy of her report was provided at the conclusion of the visit.      Less than 16 minutes were spent conducting the current feedback session with Sheryl Nichols.

## 2021-05-06 ENCOUNTER — Telehealth: Payer: Self-pay | Admitting: Family Medicine

## 2021-05-06 ENCOUNTER — Telehealth: Payer: BC Managed Care – PPO | Admitting: Family Medicine

## 2021-05-06 NOTE — Telephone Encounter (Signed)
Pt wanted to know if Tabori got the pw from the neurologist and based off that does she still need to keep her appt on 05/11/21

## 2021-05-07 NOTE — Telephone Encounter (Signed)
Called patient with provider recommendations. Patient voiced understanding. 

## 2021-05-07 NOTE — Telephone Encounter (Signed)
I did get the neuropsych notes.  If she would like to start treatment for her possible ADHD she will need to keep the appointment so we can discuss the best options.

## 2021-05-11 ENCOUNTER — Telehealth (INDEPENDENT_AMBULATORY_CARE_PROVIDER_SITE_OTHER): Payer: BC Managed Care – PPO | Admitting: Family Medicine

## 2021-05-11 ENCOUNTER — Encounter: Payer: Self-pay | Admitting: Family Medicine

## 2021-05-11 DIAGNOSIS — F902 Attention-deficit hyperactivity disorder, combined type: Secondary | ICD-10-CM

## 2021-05-11 DIAGNOSIS — G4733 Obstructive sleep apnea (adult) (pediatric): Secondary | ICD-10-CM

## 2021-05-11 MED ORDER — METHYLPHENIDATE HCL ER (OSM) 18 MG PO TBCR: 18.0000 mg | EXTENDED_RELEASE_TABLET | Freq: Every day | ORAL | 0 refills | Status: DC

## 2021-05-11 NOTE — Progress Notes (Signed)
Virtual Visit via Video   I connected with patient on 05/11/21 at 11:30 AM EDT by a video enabled telemedicine application and verified that I am speaking with the correct person using two identifiers.  Location patient: Home Location provider: [facility], Office Persons participating in the virtual visit: Patient, Provider, CMA (Sabrina M)  I discussed the limitations of evaluation and management by telemedicine and the availability of in person appointments. The patient expressed understanding and agreed to proceed.  Subjective:   HPI:   ADHD- new.  During her neuropsych testing it was discovered that pt may have undiagnosed and untreated ADHD.  Sxs date back to childhood where she struggled to sustain attention and was easily distracted.  It was 'always a family joke that I have ADHD'.  A continuous performance test indicated 'moderate likelihood' of ADHD and she scored in the 'highly likely' range when describing symptoms.  Pt and children feel sxs have dramatically worsened since having COVID.  Possible OSA- Dr Milbert Coulter recommended that pt get a sleep study as she reports non-restful sleep and family has witnessed breathing pauses.    ROS:   See pertinent positives and negatives per HPI.  Patient Active Problem List   Diagnosis Date Noted   Hypothyroidism 07/31/2007    Priority: High   ADHD (attention deficit hyperactivity disorder), combined type 04/22/2021   Lumbar radiculopathy 02/19/2021   History of COVID-19 12/02/2020   Overweight (BMI 25.0-29.9) 08/28/2020   Family history of premature coronary heart disease 04/13/2020   Recurrent urinary tract infection 08/22/2019   Hyperhidrosis 02/21/2014   Carpal tunnel syndrome 06/04/2013   Dermatographic urticaria 09/06/2010   Chronic rhinitis 09/13/2009   Asthma 08/22/2009   Anemia 07/31/2007   Heart murmur 07/31/2007    Social History   Tobacco Use   Smoking status: Never   Smokeless tobacco: Never  Substance  Use Topics   Alcohol use: Yes    Alcohol/week: 14.0 - 21.0 standard drinks    Types: 14 - 21 Shots of liquor per week    Comment: 2-3 shots of vodka nightly    Current Outpatient Medications:    beclomethasone (QVAR REDIHALER) 80 MCG/ACT inhaler, Inhale 2 puffs into the lungs 2 (two) times daily., Disp: 10.6 g, Rfl: 3   cetirizine (ZYRTEC) 10 MG tablet, Take 1 tablet (10 mg total) by mouth daily., Disp: 30 tablet, Rfl: 11   fluticasone (FLONASE) 50 MCG/ACT nasal spray, SHAKE LIQUID AND USE 2 SPRAYS IN EACH NOSTRIL DAILY, Disp: 16 g, Rfl: 3   ID NOW COVID-19 KIT, TEST AS DIRECTED TODAY, Disp: , Rfl:    methocarbamol (ROBAXIN) 500 MG tablet, Take 1 tablet (500 mg total) by mouth every 8 (eight) hours as needed for muscle spasms., Disp: 45 tablet, Rfl: 0   predniSONE (DELTASONE) 10 MG tablet, 3 tabs x3 days and then 2 tabs x3 days and then 1 tab x3 days.  Take w/ food., Disp: 18 tablet, Rfl: 0   VENTOLIN HFA 108 (90 Base) MCG/ACT inhaler, INHALE 1 TO 2 PUFFS BY MOUTH INTO THE LUNGS EVERY 4 TO 6 HOURS AS NEEDED FOR WHEEZING OR SHORTNESS OF BREATH, Disp: 18 g, Rfl: 1   HYDROmorphone (DILAUDID) 4 MG tablet, Take 1 tablet (4 mg total) by mouth every 4 (four) hours as needed for severe pain. (Patient not taking: Reported on 05/11/2021), Disp: 10 tablet, Rfl: 0  Allergies  Allergen Reactions   Codeine     REACTION: tingling all over; / nausea & vomiting  Objective:   There were no vitals taken for this visit. AAOx3, NAD NCAT, EOMI No obvious CN deficits Pt is able to speak clearly, coherently without shortness of breath or increased work of breathing.  Thought process is linear.  Mood is appropriate.   Assessment and Plan:   ADHD- new.  Reviewed Dr Melvyn Novas in-depth neuropsych report both independently and with patient.  She definitely has ADHD tendencies and [place] up on [place] this was not something that was talked about or identified.  She is willing to start a trial of stimulant  medication in hopes that this will improve her focus, her recall, and her anxiety.  Will start w/ low dose Concerta and titrate/adjust prn.  Pt expressed understanding and is in agreement w/ plan.   Possible OSA- pt's family notes breathing pauses and she admits that her sleep is not restful.  Will refer for sleep study.  Pt expressed understanding and is in agreement w/ plan.    [name], MD 05/11/2021

## 2021-05-11 NOTE — Progress Notes (Signed)
I connected with  Sheryl Nichols on 05/11/21 by a video enabled telemedicine application and verified that I am speaking with the correct person using two identifiers.   I discussed the limitations of evaluation and management by telemedicine. The patient expressed understanding and agreed to proceed.

## 2021-06-08 ENCOUNTER — Ambulatory Visit (INDEPENDENT_AMBULATORY_CARE_PROVIDER_SITE_OTHER): Payer: BC Managed Care – PPO | Admitting: Psychology

## 2021-06-08 DIAGNOSIS — F4322 Adjustment disorder with anxiety: Secondary | ICD-10-CM

## 2021-06-08 DIAGNOSIS — F4310 Post-traumatic stress disorder, unspecified: Secondary | ICD-10-CM | POA: Diagnosis not present

## 2021-06-09 ENCOUNTER — Other Ambulatory Visit: Payer: Self-pay | Admitting: Family Medicine

## 2021-06-09 DIAGNOSIS — J310 Chronic rhinitis: Secondary | ICD-10-CM

## 2021-06-14 ENCOUNTER — Other Ambulatory Visit: Payer: Self-pay

## 2021-06-14 ENCOUNTER — Telehealth: Payer: Self-pay

## 2021-06-14 MED ORDER — METHYLPHENIDATE HCL ER (OSM) 18 MG PO TBCR: 18.0000 mg | EXTENDED_RELEASE_TABLET | Freq: Every day | ORAL | 0 refills | Status: DC

## 2021-06-14 NOTE — Telephone Encounter (Signed)
Pt needs refill on methylphenidate (CONCERTA) 18 MG PO CR tablet    [id number] - [place], [place] - [address] AT [facility] OF [address]    Pt call back [phone]  LFD 05/11/21 #30 with no refills LOV 05/11/21 NOV 07/26/21

## 2021-06-14 NOTE — Telephone Encounter (Signed)
Request sent to provider for approval.  

## 2021-06-14 NOTE — Telephone Encounter (Signed)
Pt needs refill on methylphenidate (CONCERTA) 18 MG PO CR tablet   [id number] - [place], [place] - [address] AT [facility] OF [address]   Pt call back [phone]

## 2021-07-07 ENCOUNTER — Institutional Professional Consult (permissible substitution): Payer: BC Managed Care – PPO | Admitting: Plastic Surgery

## 2021-07-14 ENCOUNTER — Ambulatory Visit (INDEPENDENT_AMBULATORY_CARE_PROVIDER_SITE_OTHER): Payer: BC Managed Care – PPO | Admitting: Psychology

## 2021-07-14 DIAGNOSIS — F4310 Post-traumatic stress disorder, unspecified: Secondary | ICD-10-CM | POA: Diagnosis not present

## 2021-07-14 DIAGNOSIS — F331 Major depressive disorder, recurrent, moderate: Secondary | ICD-10-CM | POA: Diagnosis not present

## 2021-07-14 DIAGNOSIS — F1010 Alcohol abuse, uncomplicated: Secondary | ICD-10-CM

## 2021-07-20 ENCOUNTER — Other Ambulatory Visit: Payer: Self-pay

## 2021-07-20 ENCOUNTER — Encounter: Payer: Self-pay | Admitting: Neurology

## 2021-07-20 ENCOUNTER — Ambulatory Visit (INDEPENDENT_AMBULATORY_CARE_PROVIDER_SITE_OTHER): Payer: BC Managed Care – PPO | Admitting: Neurology

## 2021-07-20 VITALS — BP 113/73 | HR 69 | Ht 64.5 in | Wt 147.8 lb

## 2021-07-20 DIAGNOSIS — G478 Other sleep disorders: Secondary | ICD-10-CM | POA: Diagnosis not present

## 2021-07-20 DIAGNOSIS — R0681 Apnea, not elsewhere classified: Secondary | ICD-10-CM

## 2021-07-20 DIAGNOSIS — R0683 Snoring: Secondary | ICD-10-CM | POA: Diagnosis not present

## 2021-07-20 DIAGNOSIS — G4719 Other hypersomnia: Secondary | ICD-10-CM | POA: Diagnosis not present

## 2021-07-20 DIAGNOSIS — R351 Nocturia: Secondary | ICD-10-CM

## 2021-07-20 NOTE — Progress Notes (Signed)
Subjective:    Patient ID: Sheryl Nichols is a 50 y.o. female.  HPI    Star Age, MD, PhD [facility] [address], [address] [place], [place] [address]  Dear Dr. Birdie Riddle,  I saw your patient, Sheryl Nichols, upon your kind request in my sleep clinic today for initial consultation of her sleep disorder, in particular, concern for underlying obstructive sleep apnea.  The patient is unaccompanied today.  As you know, Ms. Nehring is a 50 year old right-handed woman with an underlying medical history of chronic rhinitis, hypothyroidism, history of COVID, asthma, anemia, ADHD, carpal tunnel syndrome and lumbar radiculopathy, who reports witnessed apneas and nonrestorative sleep.  She has not had any recent labs in the epic system.  She reports a several month history of feeling sleepy during the day, she usually does not try to take any naps.  She is a single mom.  She does not currently work.  She has 4 children, ages 54, 31, 61 and 33.  Her 50 year old daughter lives with her currently.  Patient is a non-smoker and drinks alcohol several times a week, nearly daily, usually 1 or 2glasses/day.  She drinks caffeine in the form of coffee, 2 to 3 cups/day on average.  She has nocturia about twice for average night, bedtime is around midnight and rise time around 6 or 7 AM.  She does not watch TV in her bedroom but does watch her tablet until she feels sleepy.  She denies recurrent headaches or morning headaches.  They have 1 dog in the household, the dog does not sleep with her. I reviewed your televisit note from 05/11/2021.  Her Epworth sleepiness score is .  She had sleep testing several years ago which was negative for sleep apnea.  Date of study was 09/12/2009 and study was interpreted by Dr. Baird Lyons.  AHI was 0.1/h, mild to moderate snoring was noted, average oxygen saturation was 96.9%, nadir was 94%.  She had no significant PLM's.   Her Past Medical  History Is Significant For: Past Medical History:  Diagnosis Date   ADHD (attention deficit hyperactivity disorder), combined type 04/22/2021   Anemia 07/31/2007   Iron deficiency ; onset as child pre menses; ?dietary deficiency related ( avoided meat , spinach, leafy greens,etc)   Asthma 08/22/2009   Onset:as infant Triggers (environmental, infectious, allergic):allergens ; EIB Rescue inhaler NOM:VEHMCN pre running Maintenance medications/ response:not used Smoking history:never Family history pulmonary disease: no   Carpal tunnel syndrome 06/04/2013   Improved with braces   Chronic rhinitis 09/13/2009   Dermatographic urticaria 09/06/2010   Heart murmur 07/31/2007   History of COVID-19 12/02/2020   Hyperhidrosis 02/21/2014   Hypothyroidism    Lumbar radiculopathy 02/19/2021   Migraines    resolved off sugar   Recurrent urinary tract infection 08/22/2019    Her Past Surgical History Is Significant For: Past Surgical History:  Procedure Laterality Date   BREAST ENHANCEMENT SURGERY     COLONOSCOPY  2009   for rectal bleeding; IH found   SEPTOPLASTY     sinus cystectomy   TONSILLECTOMY AND ADENOIDECTOMY     TYMPANOPLASTY     WISDOM TOOTH EXTRACTION      Her Family History Is Significant For: Family History  Problem Relation Age of Onset   Hyperlipidemia Mother    Hypertension Mother    Heart attack Mother 50   Hypothyroidism Mother    Cancer Maternal Grandfather        Brain Cancer  Diabetes Neg Hx    Stroke Neg Hx    Stomach cancer Neg Hx    Rectal cancer Neg Hx    Esophageal cancer Neg Hx    Colon cancer Neg Hx    Colon polyps Neg Hx     Her Social History Is Significant For: Social History   Socioeconomic History   Marital status: Legally Separated    Spouse name: Not on file   Number of children: Not on file   Years of education: 15   Highest education level: Some college, no degree  Occupational History   Occupation: Games developer  Tobacco Use    Smoking status: Never   Smokeless tobacco: Never  Vaping Use   Vaping Use: Never used  Substance and Sexual Activity   Alcohol use: Yes    Alcohol/week: 14.0 - 21.0 standard drinks    Types: 14 - 21 Shots of liquor per week    Comment: 2-3 shots of vodka nightly   Drug use: No   Sexual activity: Not on file  Other Topics Concern   Not on file  Social History Narrative   Born in [place], moved to [place] as a teen   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   cetirizine (ZYRTEC) 10 MG tablet Take 1 tablet (10 mg total) by mouth daily.   fluticasone (FLONASE) 50 MCG/ACT nasal spray SHAKE LIQUID AND USE 2 SPRAYS IN EACH NOSTRIL DAILY   methylphenidate (CONCERTA) 18 MG PO CR tablet Take 1 tablet (18 mg total) by mouth daily.   VENTOLIN HFA 108 (90 Base) MCG/ACT inhaler INHALE 1 TO 2 PUFFS BY MOUTH INTO THE LUNGS EVERY 4 TO 6 HOURS AS NEEDED FOR WHEEZING OR SHORTNESS OF BREATH   ID NOW COVID-19 KIT TEST AS DIRECTED TODAY   [DISCONTINUED] HYDROmorphone (DILAUDID) 4 MG tablet Take 1 tablet (4 mg total) by mouth every 4 (four) hours as needed for severe pain. (Patient not taking: Reported on 05/11/2021)   [DISCONTINUED] methocarbamol (ROBAXIN) 500 MG tablet Take 1 tablet (500 mg total) by mouth every 8 (eight) hours as needed for muscle spasms.   [DISCONTINUED] predniSONE (DELTASONE) 10 MG tablet 3 tabs x3 days and then 2 tabs x3 days and then 1  tab x3 days.  Take w/ food.   No facility-administered encounter medications on file as of 07/20/2021.  :   Review of Systems:  Out of a complete 14 point review of systems, all are reviewed and negative with the exception of these symptoms as listed below:  Review of Systems  Neurological:        Here for sleep issues, witness sleep apnea, waking up feeling exhausted.  ESS: 8, fatigue 45.   Objective:  Neurological Exam  Physical Exam Physical Examination:   Vitals:   07/20/21 1119  BP: 113/73  Pulse: 69    General Examination: The patient is a  very pleasant 50 y.o. female in no acute distress. She appears well-developed and well-nourished and well groomed.   HEENT: Normocephalic, atraumatic, pupils are equal, round and reactive to light, extraocular tracking is good without limitation to gaze excursion or nystagmus noted. Hearing is grossly intact. Face is symmetric with normal facial animation. Speech is clear with no dysarthria noted. There is no hypophonia. There is no lip, neck/head, jaw or voice tremor. Neck is supple with full range of passive and active motion. There are no carotid bruits on auscultation. Oropharynx exam reveals: mild mouth dryness, good dental hygiene with braces in place.  No significant airway crowding, small airway entry noted, Mallampati class II, tonsils absent.  Neck circumference of 13 1/8 inches.  She has a minimal overbite.  Tongue protrudes centrally and palate elevates symmetrically.   Chest: Clear to auscultation without wheezing, rhonchi or crackles noted.  Heart: S1+S2+0, regular and normal without murmurs, rubs or gallops noted.   Abdomen: Soft, non-tender and non-distended with normal bowel sounds appreciated on auscultation.  Extremities: There is no pitting edema in the distal lower extremities bilaterally.   Skin: Warm and dry without trophic changes noted.   Musculoskeletal: exam reveals no obvious joint deformities, tenderness or  joint swelling or erythema.   Neurologically:  Mental status: The patient is awake, alert and oriented in all 4 spheres. Her immediate and remote memory, attention, language skills and fund of knowledge are appropriate. There is no evidence of aphasia, agnosia, apraxia or anomia. Speech is clear with normal prosody and enunciation. Thought process is linear. Mood is normal and affect is normal.  Cranial nerves II - XII are as described above under HEENT exam.  Motor exam: Normal bulk, strength and tone is noted. There is no tremor, fine motor skills and coordination: grossly intact.  Cerebellar testing: No dysmetria or intention tremor. There is no truncal or gait ataxia.  Sensory exam: intact to light touch in the upper and lower extremities.  Gait, station and balance: She stands easily. No veering to one side is noted. No leaning to one side is noted. Posture is age-appropriate and stance is narrow based. Gait shows normal stride length and normal pace. No problems turning are noted.   Assessment and plan:  In summary, Taria Castrillo is a very pleasant 50 y.o.-year old female with an underlying medical history of chronic rhinitis, hypothyroidism, history of COVID, asthma, anemia, ADHD, carpal tunnel syndrome and lumbar radiculopathy, whose history and physical exam are concerning for obstructive sleep apnea (OSA). I had a long chat with the patient about my findings and the diagnosis of OSA, its prognosis and treatment options. We talked about medical treatments, surgical interventions and non-pharmacological approaches. I explained in particular the risks and ramifications of untreated moderate to severe OSA, especially with respect to developing cardiovascular disease down the Road, including congestive heart failure, difficult to treat hypertension, cardiac arrhythmias, or stroke. Even type 2 diabetes has, in part, been linked to untreated OSA. Symptoms of untreated OSA include daytime  sleepiness, memory problems, mood irritability and mood disorder such as depression and anxiety, lack of energy, as well as recurrent headaches, especially morning headaches. We talked about trying to maintain a healthy lifestyle in general, as well as the importance of weight control. We also talked about the importance of good sleep hygiene. I recommended the following at this time: sleep study.  I have outlined the difference between a laboratory attended sleep study versus home sleep test.  She is also  advised to talk to you about laboratory testing for her fatigue and sleepiness including looking for vitamin deficiencies, anemia, thyroid disease etc.  I explained the sleep test procedure to the patient and also outlined possible surgical and non-surgical treatment options of OSA, including the use of a custom-made dental device (which would require a referral to a specialist dentist or oral surgeon), upper airway surgical options, such as traditional UPPP or a novel less invasive surgical option in the form of Inspire hypoglossal nerve stimulation (which would involve a referral to an ENT surgeon). I also explained the CPAP treatment option to the patient, who indicated that she would be willing to try CPAP if the need arises. I explained the importance of being compliant with PAP treatment, not only for insurance purposes but primarily to improve Her symptoms, and for the patient's long term health benefit, including to reduce Her cardiovascular risks. I answered all her questions today and the patient was in agreement. I plan to see her back after the sleep study is completed and encouraged her to call with any interim questions, concerns, problems or updates.   Thank you very much for allowing me to participate in the care of this nice patient. If I can be of any further assistance to you please do not hesitate to call me at [phone].  Sincerely,   Star Age, MD, PhD

## 2021-07-20 NOTE — Patient Instructions (Signed)

## 2021-07-26 ENCOUNTER — Encounter: Payer: Self-pay | Admitting: Family Medicine

## 2021-07-26 ENCOUNTER — Telehealth (INDEPENDENT_AMBULATORY_CARE_PROVIDER_SITE_OTHER): Payer: BC Managed Care – PPO | Admitting: Family Medicine

## 2021-07-26 ENCOUNTER — Other Ambulatory Visit: Payer: Self-pay

## 2021-07-26 DIAGNOSIS — F902 Attention-deficit hyperactivity disorder, combined type: Secondary | ICD-10-CM | POA: Diagnosis not present

## 2021-07-26 MED ORDER — AMPHETAMINE-DEXTROAMPHET ER 20 MG PO CP24: 20.0000 mg | ORAL_CAPSULE | Freq: Every day | ORAL | 0 refills | Status: DC

## 2021-07-26 NOTE — Progress Notes (Signed)
   Virtual Visit via Video   I connected with patient on 07/26/21 at  2:30 PM EDT by a video enabled telemedicine application and verified that I am speaking with the correct person using two identifiers.  Location patient: Home Location provider: [facility], Office Persons participating in the virtual visit: Patient, Provider, [place] Cla[place]orne [place] C)  I discussed the limitations of evaluation and management by telemedicine and the availability of in person appointments. The patient expressed understanding and agreed to proceed.  Subjective:   HPI:   ADHD- at last visit pt was started on Concerta 43PI daily.  No change in focus or task completion.  Family has not noticed any difference either.  Denies side effects from medication but increased stress b/c she lost her nephew and daughter's current health issue.  ROS:   See pertinent positives and negatives per HPI.  Patient Active Problem List   Diagnosis Date Noted   Hypothyroidism 07/31/2007    Priority: 1.   ADHD (attention deficit hyperactivity disorder), combined type 04/22/2021   Lumbar radiculopathy 02/19/2021   History of COVID-19 12/02/2020   Overweight (BMI 25.0-29.9) 08/28/2020   Family history of premature coronary heart disease 04/13/2020   Recurrent urinary tract infection 08/22/2019   Hyperhidrosis 02/21/2014   Carpal tunnel syndrome 06/04/2013   Dermatographic urticaria 09/06/2010   Chronic rhinitis 09/13/2009   Asthma 08/22/2009   Anemia 07/31/2007   Heart murmur 07/31/2007    Social History   Tobacco Use   Smoking status: Never   Smokeless tobacco: Never  Substance Use Topics   Alcohol use: Yes    Alcohol/week: 14.0 - 21.0 standard drinks    Types: 14 - 21 Shots of liquor per week    Comment: 2-3 shots of vodka nightly    Current Outpatient Medications:    beclomethasone (QVAR REDIHALER) 80 MCG/ACT inhaler, Inhale 2 puffs into the lungs 2 (two) times daily., Disp: 10.6 g, Rfl: 3   cetirizine  (ZYRTEC) 10 MG tablet, Take 1 tablet (10 mg total) by mouth daily., Disp: 30 tablet, Rfl: 11   fluticasone (FLONASE) 50 MCG/ACT nasal spray, SHAKE LIQUID AND USE 2 SPRAYS IN EACH NOSTRIL DAILY, Disp: 16 g, Rfl: 3   methylphenidate (CONCERTA) 18 MG PO CR tablet, Take 1 tablet (18 mg total) by mouth daily., Disp: 30 tablet, Rfl: 0   VENTOLIN HFA 108 (90 Base) MCG/ACT inhaler, INHALE 1 TO 2 PUFFS BY MOUTH INTO THE LUNGS EVERY 4 TO 6 HOURS AS NEEDED FOR WHEEZING OR SHORTNESS OF BREATH, Disp: 18 g, Rfl: 1   ID NOW COVID-19 KIT, TEST AS DIRECTED TODAY (Patient not taking: Reported on 07/26/2021), Disp: , Rfl:   Allergies  Allergen Reactions   Codeine     REACTION: tingling all over; / nausea & vomiting    Objective:   There were no vitals taken for this visit. AAOx3, NAD NCAT, EOMI No obvious CN deficits Coloring WNL Pt is able to speak clearly, coherently without shortness of breath or increased work of breathing.  Thought process is linear.  Mood is appropriate.   Assessment and Plan:   ADHD- no improvement w/ addition of Concerta.  Will switch to Adderall and monitor for improvement since pt nor family has noticed any benefit w/ Concerta.  Pt expressed understanding and is in agreement w/ plan.    [name], MD 07/26/2021

## 2021-07-27 ENCOUNTER — Ambulatory Visit (INDEPENDENT_AMBULATORY_CARE_PROVIDER_SITE_OTHER): Payer: BC Managed Care – PPO | Admitting: Psychology

## 2021-07-27 DIAGNOSIS — F4310 Post-traumatic stress disorder, unspecified: Secondary | ICD-10-CM | POA: Diagnosis not present

## 2021-07-27 DIAGNOSIS — F331 Major depressive disorder, recurrent, moderate: Secondary | ICD-10-CM

## 2021-07-27 DIAGNOSIS — F1010 Alcohol abuse, uncomplicated: Secondary | ICD-10-CM

## 2021-08-02 ENCOUNTER — Ambulatory Visit (INDEPENDENT_AMBULATORY_CARE_PROVIDER_SITE_OTHER): Payer: BC Managed Care – PPO | Admitting: Neurology

## 2021-08-02 DIAGNOSIS — G4719 Other hypersomnia: Secondary | ICD-10-CM

## 2021-08-02 DIAGNOSIS — G478 Other sleep disorders: Secondary | ICD-10-CM

## 2021-08-02 DIAGNOSIS — R0683 Snoring: Secondary | ICD-10-CM

## 2021-08-02 DIAGNOSIS — G4710 Hypersomnia, unspecified: Secondary | ICD-10-CM | POA: Diagnosis not present

## 2021-08-02 DIAGNOSIS — R0681 Apnea, not elsewhere classified: Secondary | ICD-10-CM

## 2021-08-02 DIAGNOSIS — R351 Nocturia: Secondary | ICD-10-CM

## 2021-08-07 ENCOUNTER — Other Ambulatory Visit: Payer: Self-pay | Admitting: Family Medicine

## 2021-08-07 DIAGNOSIS — J4531 Mild persistent asthma with (acute) exacerbation: Secondary | ICD-10-CM

## 2021-08-09 NOTE — Progress Notes (Signed)
See procedure note.

## 2021-08-10 NOTE — Procedures (Signed)
   [facility]  HOME SLEEP TEST (Watch PAT) REPORT  STUDY DATE: 08/02/2021  DOB: 1971/10/16  MRN: [id number]  ORDERING CLINICIAN: Star Age, MD, PhD   REFERRING CLINICIAN: Midge Minium, MD   CLINICAL INFORMATION/HISTORY: 50 year old right-handed woman with an underlying medical history of chronic rhinitis, hypothyroidism, history of COVID, asthma, anemia, ADHD, carpal tunnel syndrome and lumbar radiculopathy, who reports witnessed apneas and nonrestorative sleep.    Epworth sleepiness score: 8/24.  BMI: 24.6 kg/m  FINDINGS:   Sleep Summary:   Total Recording Time (hours, min): 10 hours, 33 minutes  Total Sleep Time (hours, min):  9 hours, 13 minutes   Percent REM (%):    25.2%   Respiratory Indices:   Calculated pAHI (per hour):  3.4/hour         REM pAHI:    7.8/hour       NREM pAHI: 1.9/hour  Oxygen Saturation Statistics:    Oxygen Saturation (%) Mean: 95%   Minimum oxygen saturation (%):                 88%   O2 Saturation Range (%): 88-98%    O2 Saturation (minutes) <=88%: 0.2 min  Pulse Rate Statistics:   Pulse Mean (bpm):    68/min    Pulse Range (46-112/min)   IMPRESSION: Primary snoring   RECOMMENDATION:  This home sleep test does not demonstrate any significant obstructive or central sleep disordered breathing.  Overall AHI was less than 5/h, some snoring was noted and appeared intermittent, mild to moderate. Other causes of the patient's symptoms, including circadian rhythm disturbances, an underlying mood disorder, medication effect and/or an underlying medical problem cannot be ruled out based on this test. Clinical correlation is recommended. The patient should be cautioned not to drive, work at heights, or operate dangerous or heavy equipment when tired or sleepy. Review and reiteration of good sleep hygiene measures should be pursued with any patient. The patient can follow up with her referring provider, who will  be notified of the test results.   I certify that I have reviewed the raw data recording prior to the issuance of this report in accordance with the standards of the American Academy of Sleep Medicine (AASM).  INTERPRETING PHYSICIAN:   Star Age, MD, PhD  Board Certified in Neurology and Sleep Medicine  [facility] [address] [place], [place] [address] [phone]

## 2021-08-11 ENCOUNTER — Ambulatory Visit (INDEPENDENT_AMBULATORY_CARE_PROVIDER_SITE_OTHER): Payer: BC Managed Care – PPO | Admitting: Psychology

## 2021-08-11 ENCOUNTER — Telehealth: Payer: Self-pay | Admitting: *Deleted

## 2021-08-11 DIAGNOSIS — F4310 Post-traumatic stress disorder, unspecified: Secondary | ICD-10-CM

## 2021-08-11 DIAGNOSIS — F331 Major depressive disorder, recurrent, moderate: Secondary | ICD-10-CM

## 2021-08-11 DIAGNOSIS — F1010 Alcohol abuse, uncomplicated: Secondary | ICD-10-CM | POA: Diagnosis not present

## 2021-08-11 NOTE — Telephone Encounter (Signed)
Spoke with patient and discussed sleep study results.  Patient aware her home sleep test did not show any significant obstructive sleep apnea.  Intermittent snoring was noted ranging from mild to moderate.  Treatment with a CPAP or AutoPap is not recommended.  Patient aware that at this juncture she can follow-up with her primary care physician, Dr. Birdie Riddle.  I advised the results will be sent over to primary care.  Results are also available on mychart to look at in more detail.  Patient's questions were answered and she verbalized appreciation and was happy with the results.

## 2021-08-11 NOTE — Telephone Encounter (Signed)
-----   Message from Star Age, MD sent at 08/10/2021  5:45 PM EDT ----- Patient referred by Dr. Birdie Riddle, seen by me on 07/20/2021, HST on 08/02/2021.   Please call and notify the patient that the recent home sleep test did not show any significant obstructive sleep apnea.  Intermittent snoring was noted, ranging from mild to moderate.  Treatment with a CPAP or AutoPap is not recommended.  At this juncture, she can follow-up with her primary care physician.    Thanks,  Star Age, MD, PhD [facility])

## 2021-08-16 ENCOUNTER — Telehealth: Payer: Self-pay | Admitting: Family Medicine

## 2021-08-16 MED ORDER — ALBUTEROL SULFATE HFA 108 (90 BASE) MCG/ACT IN AERS: INHALATION_SPRAY | RESPIRATORY_TRACT | 1 refills | Status: DC

## 2021-08-16 NOTE — Telephone Encounter (Signed)
RX sent to pharmacy  

## 2021-08-16 NOTE — Telephone Encounter (Signed)
..  Caller name:  Tejasvi Brissett  Caller callback [phone]  Encourage patient to contact the pharmacy for refills or they can request refills through [facility]  (Please schedule appointment if patient has not been seen in over a year)  MEDICATION NAME & DOSE:  Rescue Inhaler - Albuterol  Notes/Comments from patient:  [place]:   [place] [address]  Please notify patient: It takes 48-72 hours to process rx refill requests Ask patient to call pharmacy to ensure rx is ready before heading there.   (CLINICAL TO FILL OR ROUTE PER PROTOCOLS)

## 2021-08-17 ENCOUNTER — Other Ambulatory Visit: Payer: Self-pay

## 2021-08-17 ENCOUNTER — Encounter: Payer: Self-pay | Admitting: Plastic Surgery

## 2021-08-17 ENCOUNTER — Ambulatory Visit (INDEPENDENT_AMBULATORY_CARE_PROVIDER_SITE_OTHER): Payer: BC Managed Care – PPO | Admitting: Plastic Surgery

## 2021-08-17 ENCOUNTER — Institutional Professional Consult (permissible substitution): Payer: BC Managed Care – PPO | Admitting: Plastic Surgery

## 2021-08-17 DIAGNOSIS — N62 Hypertrophy of breast: Secondary | ICD-10-CM | POA: Insufficient documentation

## 2021-08-17 DIAGNOSIS — M549 Dorsalgia, unspecified: Secondary | ICD-10-CM | POA: Insufficient documentation

## 2021-08-17 DIAGNOSIS — G8929 Other chronic pain: Secondary | ICD-10-CM | POA: Diagnosis not present

## 2021-08-17 DIAGNOSIS — M546 Pain in thoracic spine: Secondary | ICD-10-CM

## 2021-08-17 NOTE — Progress Notes (Signed)
Patient ID: Sheryl Nichols, female    DOB: [birth year], 50 y.o.   MRN: [id number]   Chief Complaint  Patient presents with   Advice Only    The patient is a 50 year old female here for evaluation of her breasts.  She had saline implants placed approximately 25 years ago.  She thinks that they are above the muscle and were placed from an axillary approach.  She thinks that they are also 450 cc in size.  She is 5 feet 4 inches tall and weighs 142 pounds.  Her sternal notch to right areola is 28 cm and left is 28 cm and her nipple to inframammary distance is 14 cm.  She complains of some back pain and shoulder pain.  She would like to have the implants removed.  She still wants to have fullness but she is not sure yet if she wants to have implants placed.  Her last mammogram was January 2022 and was negative.  She complains of sometimes having rashes at the inframammary fold.  She has gone through physical therapy without a whole lot of change to her size.  I do not feel any lumps or bumps.  The implants do appear to be above the muscle.   Review of Systems  Constitutional: Negative.   HENT: Negative.    Eyes: Negative.   Respiratory: Negative.    Cardiovascular: Negative.   Gastrointestinal: Negative.   Endocrine: Negative.   Genitourinary: Negative.   Musculoskeletal: Negative.   Skin: Negative.   Hematological: Negative.   Psychiatric/Behavioral: Negative.     Past Medical History:  Diagnosis Date   ADHD (attention deficit hyperactivity disorder), combined type 04/22/2021   Anemia 07/31/2007   Iron deficiency ; onset as child pre menses; ?dietary deficiency related ( avoided meat , spinach, leafy greens,etc)   Asthma 08/22/2009   Onset:as infant Triggers (environmental, infectious, allergic):allergens ; EIB Rescue inhaler MBE:[id number] pre running Maintenance medications/ response:not used Smoking history:never Family history pulmonary disease: no   Carpal tunnel syndrome  06/04/2013   Improved with braces   Chronic rhinitis 09/13/2009   Dermatographic urticaria 09/06/2010   Heart murmur 07/31/2007   History of COVID-19 12/02/2020   Hyperhidrosis 02/21/2014   Hypothyroidism    Lumbar radiculopathy 02/19/2021   Migraines    resolved off sugar   Recurrent urinary tract infection 08/22/2019    Past Surgical History:  Procedure Laterality Date   BREAST ENHANCEMENT SURGERY     COLONOSCOPY  2009   for rectal bleeding; IH found   SEPTOPLASTY     sinus cystectomy   TONSILLECTOMY AND ADENOIDECTOMY     TYMPANOPLASTY     WISDOM TOOTH EXTRACTION        Current Outpatient Medications:    albuterol (VENTOLIN HFA) 108 (90 Base) MCG/ACT inhaler, INHALE 1 TO 2 PUFFS BY MOUTH INTO THE LUNGS EVERY 4 TO 6 HOURS AS NEEDED FOR WHEEZING OR SHORTNESS OF BREATH, Disp: 18 g, Rfl: 1   amphetamine-dextroamphetamine (ADDERALL XR) 20 MG 24 hr capsule, Take 1 capsule (20 mg total) by mouth daily., Disp: 30 capsule, Rfl: 0   cetirizine (ZYRTEC) 10 MG tablet, Take 1 tablet (10 mg total) by mouth daily., Disp: 30 tablet, Rfl: 11   fluticasone (FLONASE) 50 MCG/ACT nasal spray, SHAKE LIQUID AND USE 2 SPRAYS IN EACH NOSTRIL DAILY, Disp: 16 g, Rfl: 3   ID NOW COVID-19 KIT, , Disp: , Rfl:    QVAR REDIHALER 80 MCG/ACT inhaler, INHALE 2 PUFFS INTO THE LUNGS  TWICE DAILY, Disp: 10.6 g, Rfl: 3   Objective:   Vitals:   08/17/21 1343  BP: 127/76  Pulse: 76  SpO2: 99%    Physical Exam Vitals and nursing note reviewed.  Constitutional:      Appearance: Normal appearance.  Cardiovascular:     Rate and Rhythm: Normal rate.     Pulses: Normal pulses.  Pulmonary:     Effort: Pulmonary effort is normal.  Abdominal:     Palpations: Abdomen is soft.  Musculoskeletal:        General: No swelling.  Skin:    General: Skin is warm.     Coloration: Skin is not jaundiced or pale.  Neurological:     Mental Status: She is alert and oriented to person, place, and time.    Assessment &  Plan:  Symptomatic mammary hypertrophy  Chronic bilateral thoracic back pain  The procedure the patient selected and that was best for the patient was discussed. The risk were discussed and include but not limited to the following:  Breast asymmetry, fluid accumulation, firmness of the breast, inability to breast feed, loss of nipple or areola, skin loss, change in skin and nipple sensation, fat necrosis of the breast tissue, bleeding, infection and healing delay.  There are risks of anesthesia and injury to nerves or blood vessels.  Allergic reaction to tape, suture and skin glue are possible.  There will be swelling.  Any of these can lead to the need for revisional surgery.  A breast reduction has potential to interfere with diagnostic procedures in the future.  This procedure is best done when the breast is fully developed.  Changes in the breast will continue to occur over time: pregnancy, weight gain or weigh loss.    Total time: 40 minutes. This includes time spent with the patient during the visit as well as time spent before and after the visit reviewing the chart, documenting the encounter and ordering pertinent studies. and literature emailed to the patient.   Physical therapy: Already done and we have requested release of information Mammogram: Done for 2022 and we have requested release of information Healthy Weight and Wellness: Not indicated Patient is a good candidate for bilateral removal of implants with a mastopexy.  She does not really want to be less perky so she will need to decide on whether or not she wants implants placed back in.  She is going to think about it and let us know.  Pictures were obtained of the patient and placed in the chart with the patient's or guardian's permission.    [place], DO

## 2021-08-18 ENCOUNTER — Telehealth: Payer: BC Managed Care – PPO | Admitting: Family Medicine

## 2021-08-19 ENCOUNTER — Other Ambulatory Visit: Payer: Self-pay

## 2021-08-19 ENCOUNTER — Telehealth (INDEPENDENT_AMBULATORY_CARE_PROVIDER_SITE_OTHER): Payer: BC Managed Care – PPO | Admitting: Family Medicine

## 2021-08-19 DIAGNOSIS — R059 Cough, unspecified: Secondary | ICD-10-CM

## 2021-08-19 MED ORDER — BENZONATATE 100 MG PO CAPS: ORAL_CAPSULE | ORAL | 0 refills | Status: DC

## 2021-08-19 MED ORDER — PREDNISONE 20 MG PO TABS: 40.0000 mg | ORAL_TABLET | Freq: Every day | ORAL | 0 refills | Status: DC

## 2021-08-19 NOTE — Progress Notes (Signed)
Virtual Visit via Video Note  I connected with Sheryl Nichols  on 08/19/21 at  1:20 PM EDT by a video enabled telemedicine application and verified that I am speaking with the correct person using two identifiers.  Location patient: home, [place] Location provider:work or home office Persons participating in the virtual visit: patient, provider  I discussed the limitations of evaluation and management by telemedicine and the availability of in person appointments. The patient expressed understanding and agreed to proceed.   HPI:  Acute telemedicine visit for cough and congestion: -Onset: about 1 week -did a covid test the 2nd day -Symptoms include:nasal congestion, sob, cough, had some diarrhea and vomiting initially, low energy, laryngitis, has asthma and has had some asthma symptoms (chest discomfort/sob) with this - rescue inhaler really helps, wheezing occasionally - using inhaler twice daily -feels cough is excessive despite the inhaler and feels may need prednisone, has used in the past -Denies:fevers, body aches -Has tried: cough medication otc, albuterol, is on her qvar -Pertinent past medical history: see below -Pertinent medication allergies: Allergies  Allergen Reactions   Codeine     REACTION: tingling all over; / nausea & vomiting  -COVID-19 vaccine status: 2 doses and 3 boosters; thinks did have her flu shot too  ROS: See pertinent positives and negatives per HPI.  Past Medical History:  Diagnosis Date   ADHD (attention deficit hyperactivity disorder), combined type 04/22/2021   Anemia 07/31/2007   Iron deficiency ; onset as child pre menses; ?dietary deficiency related ( avoided meat , spinach, leafy greens,etc)   Asthma 08/22/2009   Onset:as infant Triggers (environmental, infectious, allergic):allergens ; EIB Rescue inhaler OZH:[id number] pre running Maintenance medications/ response:not used Smoking history:never Family history pulmonary disease: no   Carpal tunnel syndrome  06/04/2013   Improved with braces   Chronic rhinitis 09/13/2009   Dermatographic urticaria 09/06/2010   Heart murmur 07/31/2007   History of COVID-19 12/02/2020   Hyperhidrosis 02/21/2014   Hypothyroidism    Lumbar radiculopathy 02/19/2021   Migraines    resolved off sugar   Recurrent urinary tract infection 08/22/2019    Past Surgical History:  Procedure Laterality Date   BREAST ENHANCEMENT SURGERY     COLONOSCOPY  2009   for rectal bleeding; IH found   SEPTOPLASTY     sinus cystectomy   TONSILLECTOMY AND ADENOIDECTOMY     TYMPANOPLASTY     WISDOM TOOTH EXTRACTION       Current Outpatient Medications:    albuterol (VENTOLIN HFA) 108 (90 Base) MCG/ACT inhaler, INHALE 1 TO 2 PUFFS BY MOUTH INTO THE LUNGS EVERY 4 TO 6 HOURS AS NEEDED FOR WHEEZING OR SHORTNESS OF BREATH, Disp: 18 g, Rfl: 1   amphetamine-dextroamphetamine (ADDERALL XR) 20 MG 24 hr capsule, Take 1 capsule (20 mg total) by mouth daily., Disp: 30 capsule, Rfl: 0   benzonatate (TESSALON PERLES) 100 MG capsule, 1-2 capsules twice daily as needed, Disp: 30 capsule, Rfl: 0   cetirizine (ZYRTEC) 10 MG tablet, Take 1 tablet (10 mg total) by mouth daily., Disp: 30 tablet, Rfl: 11   fluticasone (FLONASE) 50 MCG/ACT nasal spray, SHAKE LIQUID AND USE 2 SPRAYS IN EACH NOSTRIL DAILY, Disp: 16 g, Rfl: 3   ID NOW COVID-19 KIT, , Disp: , Rfl:    predniSONE (DELTASONE) 20 MG tablet, Take 2 tablets (40 mg total) by mouth daily with breakfast., Disp: 8 tablet, Rfl: 0   QVAR REDIHALER 80 MCG/ACT inhaler, INHALE 2 PUFFS INTO THE LUNGS TWICE DAILY, Disp: 10.6 g, Rfl:  3  EXAM:  VITALS per patient if applicable:  GENERAL: alert, oriented, appears well and in no acute distress  HEENT: atraumatic, conjunttiva clear, no obvious abnormalities on inspection of external nose and ears  NECK: normal movements of the head and neck  LUNGS: on inspection no signs of respiratory distress, breathing rate appears normal, no obvious gross SOB,  gasping or wheezing  CV: no obvious cyanosis  MS: moves all visible extremities without noticeable abnormality  PSYCH/NEURO: pleasant and cooperative, no obvious depression or anxiety, speech and thought processing grossly intact  ASSESSMENT AND PLAN:  Discussed the following assessment and plan:  Cough, unspecified type  -we discussed possible serious and likely etiologies, options for evaluation and workup, limitations of telemedicine visit vs in person visit, treatment, treatment risks and precautions. Pt is agreeable to treatment via telemedicine at this moment.  Query viral respiratory illness, bronchitis, possible COVID with false negative testing versus other.  Opted for treatment with Tessalon for cough, albuterol every 4 to 6 hours as needed, prednisone if not responding to these other measures. Work/School slipped offered: declinedAdvised to seek prompt in person care if worsening, new symptoms arise, or if is not improving with treatment. Discussed options for inperson care if PCP office not available. Did let this patient know that I only do telemedicine on Tuesdays and Thursdays for [place]. Advised to schedule follow up visit with PCP or [facility] if any further questions or concerns to avoid delays in care.   I discussed the assessment and treatment plan with the patient. The patient was provided an opportunity to ask questions and all were answered. The patient agreed with the plan and demonstrated an understanding of the instructions.     Lucretia Kern, DO

## 2021-08-19 NOTE — Patient Instructions (Signed)
-  I sent the medication(s) we discussed to your pharmacy: Meds ordered this encounter  Medications   benzonatate (TESSALON PERLES) 100 MG capsule    Sig: 1-2 capsules twice daily as needed    Dispense:  30 capsule    Refill:  0   predniSONE (DELTASONE) 20 MG tablet    Sig: Take 2 tablets (40 mg total) by mouth daily with breakfast.    Dispense:  8 tablet    Refill:  0   Use your albuterol every 4-6 hours as needed.  I hope you are feeling better soon!  Seek in person care promptly if you are struggling to breathe, you are having chest pain, your asthma symptoms or not responding to your inhaler, your symptoms worsen, new concerns arise or you are not improving with treatment.  It was nice to meet you today. I help [place] out with telemedicine visits on Tuesdays and Thursdays and am available for visits on those days. If you have any concerns or questions following this visit please schedule a follow up visit with your Primary Care doctor or seek care at a local urgent care clinic to avoid delays in care.

## 2021-08-20 ENCOUNTER — Telehealth (INDEPENDENT_AMBULATORY_CARE_PROVIDER_SITE_OTHER): Payer: BC Managed Care – PPO | Admitting: Family Medicine

## 2021-08-20 ENCOUNTER — Encounter: Payer: Self-pay | Admitting: Family Medicine

## 2021-08-20 ENCOUNTER — Other Ambulatory Visit: Payer: Self-pay

## 2021-08-20 DIAGNOSIS — F902 Attention-deficit hyperactivity disorder, combined type: Secondary | ICD-10-CM

## 2021-08-20 MED ORDER — AMPHETAMINE-DEXTROAMPHET ER 20 MG PO CP24: 20.0000 mg | ORAL_CAPSULE | Freq: Every day | ORAL | 0 refills | Status: DC

## 2021-08-20 NOTE — Progress Notes (Signed)
Virtual Visit via Video   I connected with patient on 08/20/21 at 11:00 AM EDT by a video enabled telemedicine application and verified that I am speaking with the correct person using two identifiers.  Location patient: Home Location provider: [place], Office Persons participating in the virtual visit: Patient, Provider, [place] (Sabrina M)  I discussed the limitations of evaluation and management by telemedicine and the availability of in person appointments. The patient expressed understanding and agreed to proceed.  Subjective:   HPI:   ADHD- at last visit pt was switched from Concerta to Adderall $RemoveBef'20mg'[id number]$ .  'There was a difference on day 1'.  Notes decreased appetite but she is making herself eat.  Denies palpitations.  Family noticed a big improvement.  Pt is able to complete work tasks much more quickly which is providing free time that she has not had.    ROS:   See pertinent positives and negatives per HPI.  Patient Active Problem List   Diagnosis Date Noted   Hypothyroidism 07/31/2007    Priority: 1.   Symptomatic mammary hypertrophy 08/17/2021   Back pain 08/17/2021   ADHD (attention deficit hyperactivity disorder), combined type 04/22/2021   Lumbar radiculopathy 02/19/2021   History of COVID-19 12/02/2020   Overweight (BMI 25.0-29.9) 08/28/2020   Family history of premature coronary heart disease 04/13/2020   Recurrent urinary tract infection 08/22/2019   Hyperhidrosis 02/21/2014   Carpal tunnel syndrome 06/04/2013   Dermatographic urticaria 09/06/2010   Chronic rhinitis 09/13/2009   Asthma 08/22/2009   Anemia 07/31/2007   Heart murmur 07/31/2007    Social History   Tobacco Use   Smoking status: Never   Smokeless tobacco: Never  Substance Use Topics   Alcohol use: Yes    Alcohol/week: 14.0 - 21.0 standard drinks    Types: 14 - 21 Shots of liquor per week    Comment: 2-3 shots of vodka nightly    Current Outpatient Medications:    albuterol  (VENTOLIN HFA) 108 (90 Base) MCG/ACT inhaler, INHALE 1 TO 2 PUFFS BY MOUTH INTO THE LUNGS EVERY 4 TO 6 HOURS AS NEEDED FOR WHEEZING OR SHORTNESS OF BREATH, Disp: 18 g, Rfl: 1   amphetamine-dextroamphetamine (ADDERALL XR) 20 MG 24 hr capsule, Take 1 capsule (20 mg total) by mouth daily., Disp: 30 capsule, Rfl: 0   benzonatate (TESSALON PERLES) 100 MG capsule, 1-2 capsules twice daily as needed, Disp: 30 capsule, Rfl: 0   cetirizine (ZYRTEC) 10 MG tablet, Take 1 tablet (10 mg total) by mouth daily., Disp: 30 tablet, Rfl: 11   fluticasone (FLONASE) 50 MCG/ACT nasal spray, SHAKE LIQUID AND USE 2 SPRAYS IN EACH NOSTRIL DAILY, Disp: 16 g, Rfl: 3   predniSONE (DELTASONE) 20 MG tablet, Take 2 tablets (40 mg total) by mouth daily with breakfast., Disp: 8 tablet, Rfl: 0   QVAR REDIHALER 80 MCG/ACT inhaler, INHALE 2 PUFFS INTO THE LUNGS TWICE DAILY, Disp: 10.6 g, Rfl: 3   ID NOW COVID-19 KIT, , Disp: , Rfl:   Allergies  Allergen Reactions   Codeine     REACTION: tingling all over; / nausea & vomiting    Objective:   LMP 08/17/2021 (Exact Date)  AAOx3, NAD NCAT, EOMI No obvious CN deficits Coloring WNL Pt is able to speak clearly, coherently without shortness of breath or increased work of breathing.  Thought process is linear.  Mood is appropriate.   Assessment and Plan:   ADHD- pt noticed an immediate improvement upon switching to Adderall.  Is able  to complete her tasks, stay focused, and she has even found that she has free time in her day.  No insomnia or palpitations.  Some loss of appetite but pt is aware and making sure she eats.  No changes at this time.  Will follow.   [name], MD 08/20/2021

## 2021-08-25 ENCOUNTER — Ambulatory Visit (INDEPENDENT_AMBULATORY_CARE_PROVIDER_SITE_OTHER): Payer: BC Managed Care – PPO | Admitting: Psychology

## 2021-08-25 DIAGNOSIS — F4322 Adjustment disorder with anxiety: Secondary | ICD-10-CM | POA: Diagnosis not present

## 2021-08-25 DIAGNOSIS — F4310 Post-traumatic stress disorder, unspecified: Secondary | ICD-10-CM | POA: Diagnosis not present

## 2021-09-08 ENCOUNTER — Ambulatory Visit (INDEPENDENT_AMBULATORY_CARE_PROVIDER_SITE_OTHER): Payer: BC Managed Care – PPO | Admitting: Psychology

## 2021-09-08 DIAGNOSIS — F4310 Post-traumatic stress disorder, unspecified: Secondary | ICD-10-CM

## 2021-09-22 ENCOUNTER — Other Ambulatory Visit: Payer: Self-pay | Admitting: Family Medicine

## 2021-09-25 ENCOUNTER — Other Ambulatory Visit: Payer: Self-pay | Admitting: Family Medicine

## 2021-10-05 ENCOUNTER — Telehealth: Payer: Self-pay | Admitting: Family Medicine

## 2021-10-05 MED ORDER — AMPHETAMINE-DEXTROAMPHET ER 20 MG PO CP24: 20.0000 mg | ORAL_CAPSULE | Freq: Every day | ORAL | 0 refills | Status: DC

## 2021-10-05 NOTE — Telephone Encounter (Signed)
Pt called in asking for a refill on the adderall, please advise   Pt uses [facility] on [address] and last appt was 08/20/21 with Birdie Riddle

## 2021-10-05 NOTE — Telephone Encounter (Signed)
Prescription filled at pt's request ?

## 2021-10-08 ENCOUNTER — Ambulatory Visit (INDEPENDENT_AMBULATORY_CARE_PROVIDER_SITE_OTHER): Payer: Self-pay | Admitting: Physician Assistant

## 2021-10-08 ENCOUNTER — Encounter: Payer: Self-pay | Admitting: Physician Assistant

## 2021-10-08 ENCOUNTER — Other Ambulatory Visit: Payer: Self-pay

## 2021-10-08 VITALS — BP 122/54 | HR 86 | Ht 64.5 in | Wt 141.6 lb

## 2021-10-08 DIAGNOSIS — Z719 Counseling, unspecified: Secondary | ICD-10-CM

## 2021-10-08 DIAGNOSIS — Z9882 Breast implant status: Secondary | ICD-10-CM

## 2021-10-08 MED ORDER — CEPHALEXIN 500 MG PO CAPS: 500.0000 mg | ORAL_CAPSULE | Freq: Four times a day (QID) | ORAL | 0 refills | Status: AC

## 2021-10-08 MED ORDER — ONDANSETRON 4 MG PO TBDP: 4.0000 mg | ORAL_TABLET | Freq: Three times a day (TID) | ORAL | 0 refills | Status: DC | PRN

## 2021-10-08 MED ORDER — HYDROCODONE-ACETAMINOPHEN 5-325 MG PO TABS: 1.0000 | ORAL_TABLET | Freq: Four times a day (QID) | ORAL | 0 refills | Status: AC | PRN

## 2021-10-08 NOTE — Progress Notes (Addendum)
ez    Patient ID: Sheryl Nichols, female    DOB: [birth year], 50 y.o.   MRN: [id number]  Chief Complaint  Patient presents with   Pre-op Exam      ICD-10-CM   1. History of bilateral breast implants  Z98.82        History of Present Illness: Sheryl Nichols is a 50 y.o.  female  with a history of saline breast implant placement.  She presents for preoperative evaluation for upcoming procedure, bilateral implant removal with mastopexy, scheduled for 09/25/2021 with Dr. Marla Roe.  The patient has not had problems with anesthesia. She confirms with me that she is not interested in having implants placed, but rather simply wants removal and mastopexy.  She understands that her breasts may look deflated and is okay with that likely probability.  She does not require any medical clearance.  She understands that she will need to hold her Adderall day of surgery.  She denies any recent illnesses or infections.  No recent hospitalizations.  She does have asthma, but reports that his relatively well controlled on daily steroid inhaler.  She has a spider veins, but no varicosities.  No personal or family history of blood clots or clotting disorder.  No history of cancer.  She is not on any hormone therapy and does not take any over-the-counter supplements.  Patient also suspect that she may have a latex allergy.  She is unsure if it was the latex gloves or the tape adhesive that caused her to have a rash.  Summary of Previous Visit: Patient was seen most recently on 08/17/2021 by Dr. Marla Roe.  Patient describes back and shoulder discomfort in the context of her saline breast implant placement approximately 25 years ago.  She believes that the implants are above the muscle and roughly 400 cc in size.  STN 28 cm bilaterally.  While she still would like to have some degree of fullness, she is unsure as to whether or not she would like to have implants placed.  She did not experience any improvement  with physical therapy.  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  PMH Significant for: Breast implant placement with subsequent chronic thoracic region discomfort, ADHD.   Past Medical History: Allergies: Allergies  Allergen Reactions   Codeine     REACTION: tingling all over; / nausea & vomiting    Current Medications:  Current Outpatient Medications:    albuterol (VENTOLIN HFA) 108 (90 Base) MCG/ACT inhaler, INHALE 1 TO 2 PUFFS BY MOUTH EVERY 4 TO 6 HOURS AS NEEDED FOR WHEEZING OR SHORTNESS OF BREATH, Disp: 6.7 g, Rfl: 0   amphetamine-dextroamphetamine (ADDERALL XR) 20 MG 24 hr capsule, Take 1 capsule (20 mg total) by mouth daily., Disp: 30 capsule, Rfl: 0   cetirizine (ZYRTEC) 10 MG tablet, TAKE 1 TABLET(10 MG) BY MOUTH DAILY, Disp: 30 tablet, Rfl: 11   fluticasone (FLONASE) 50 MCG/ACT nasal spray, SHAKE LIQUID AND USE 2 SPRAYS IN EACH NOSTRIL DAILY, Disp: 16 g, Rfl: 3   ID NOW COVID-19 KIT, , Disp: , Rfl:    QVAR REDIHALER 80 MCG/ACT inhaler, INHALE 2 PUFFS INTO THE LUNGS TWICE DAILY, Disp: 10.6 g, Rfl: 3  Past Medical Problems: Past Medical History:  Diagnosis Date   ADHD (attention deficit hyperactivity disorder), combined type 04/22/2021   Anemia 07/31/2007   Iron deficiency ; onset as child pre menses; ?dietary deficiency related ( avoided meat , spinach, leafy greens,etc)   Asthma 08/22/2009   Onset:as infant Triggers (environmental, infectious,  allergic):allergens ; EIB Rescue inhaler FFM:[id number] pre running Maintenance medications/ response:not used Smoking history:never Family history pulmonary disease: no   Carpal tunnel syndrome 06/04/2013   Improved with braces   Chronic rhinitis 09/13/2009   Dermatographic urticaria 09/06/2010   Heart murmur 07/31/2007   History of COVID-19 12/02/2020   Hyperhidrosis 02/21/2014   Hypothyroidism    Lumbar radiculopathy 02/19/2021   Migraines    resolved off sugar   Recurrent urinary tract infection 08/22/2019    Past  Surgical History: Past Surgical History:  Procedure Laterality Date   BREAST ENHANCEMENT SURGERY     COLONOSCOPY  2009   for rectal bleeding; IH found   SEPTOPLASTY     sinus cystectomy   TONSILLECTOMY AND ADENOIDECTOMY     TYMPANOPLASTY     WISDOM TOOTH EXTRACTION      Social History: Social History   Socioeconomic History   Marital status: Legally Separated    Spouse name: Not on file   Number of children: Not on file   Years of education: 15   Highest education level: Some college, no degree  Occupational History   Occupation: Games developer  Tobacco Use   Smoking status: Never   Smokeless tobacco: Never  Vaping Use   Vaping Use: Never used  Substance and Sexual Activity   Alcohol use: Yes    Alcohol/week: 14.0 - 21.0 standard drinks    Types: 14 - 21 Shots of liquor per week    Comment: 2-3 shots of vodka nightly   Drug use: No   Sexual activity: Not on file  Other Topics Concern   Not on file  Social History Narrative   Born in [place], moved to [place] as a teen   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, 4 children   Social Determinants of Radio broadcast assistant Strain: Not on file  Food Insecurity: Not on file  Transportation Needs: Not on file  Physical Activity: Not on file  Stress: Not on file  Social Connections: Not on file  Intimate Partner Violence: Not on file    Family History: Family History  Problem Relation Age of Onset   Hyperlipidemia Mother    Hypertension Mother    Heart attack Mother 35   Hypothyroidism Mother    Cancer Maternal Grandfather        Brain Cancer   Diabetes Neg Hx    Stroke Neg Hx    Stomach cancer Neg Hx    Rectal cancer Neg Hx    Esophageal cancer Neg Hx    Colon cancer Neg Hx    Colon polyps Neg Hx     Review of Systems: ROS Denies recent illness or infection.  Physical Exam: Vital Signs There were no vitals taken for this visit.  Physical Exam Constitutional:      General: Not in acute  distress.    Appearance: Normal appearance. Not ill-appearing.  HENT:     Head: Normocephalic and atraumatic.  Eyes:     Pupils: Pupils are equal, round. Cardiovascular:     Rate and Rhythm: Normal rate.    Pulses: Normal pulses.  Pulmonary:     Effort: No respiratory distress or increased work of breathing.  Speaks in full sentences. Abdominal:     General: Abdomen is flat. No distension.   Musculoskeletal: Normal range of motion. No lower extremity swelling or edema. No varicosities.  Spider veins noted. Skin:    General: Skin is warm and dry.     Findings: No erythema  or rash.  Neurological:     Mental Status: Alert and oriented to person, place, and time.  Psychiatric:        Mood and Affect: Mood normal.        Behavior: Behavior normal.    Assessment/Plan: The patient is scheduled for bilateral implant removal with mastopexy 10/22/2021 with Dr. Marla Roe.  Risks, benefits, and alternatives of procedure discussed, questions answered and consent obtained.    Smoking Status: Non-smoker. Last Mammogram: 10/2020; Results: BI-RADS Category 2: Negative  Caprini Score: 3; Risk Factors include: Age and length of planned surgery. Recommendation for mechanical prophylaxis. Encourage early ambulation.   Pictures obtained: 08/17/2021  Post-op Rx sent to pharmacy: Zofran, Keflex, Norco.  Reports codeine allergy, but can tolerate hydrocodone without difficulty.  Patient was provided with the General Surgical Risk consent document and Pain Medication Agreement prior to their appointment.  They had adequate time to read through the risk consent documents and Pain Medication Agreement. We also discussed them in person together during this preop appointment. All of their questions were answered to their satisfaction.  Recommended calling if they have any further questions.  Risk consent form and Pain Medication Agreement to be scanned into patient's chart.  The risk that can be encountered  with mastopexy/breast lift were discussed and include the following but not limited to these:  Breast asymmetry, fluid accumulation, firmness of the breast, inability to breast feed, loss of nipple or areola, skin loss, decrease or no nipple sensation, fat necrosis of the breast tissue, bleeding, infection, healing delay.  There are risks of anesthesia, changes to skin sensation and injury to nerves or blood vessels.  The muscle can be temporarily or permanently injured.  You may have an allergic reaction to tape, suture, glue, blood products which can result in skin discoloration, swelling, pain, skin lesions, poor healing.  Any of these can lead to the need for revisonal surgery or stage procedures.  A mastopexy has potential to interfere with diagnostic procedures.  Nipple or breast piercing can increase risks of infection.  This procedure is best done when the breast is fully developed.  Changes in the breast will continue to occur over time.  Pregnancy can alter the outcomes of previous breast surgery, weight gain and weigh loss can also effect the long term appearance.     Electronically signed by: Krista Blue, PA-C 10/08/2021 10:59 AM

## 2021-10-13 ENCOUNTER — Other Ambulatory Visit: Payer: Self-pay | Admitting: Family Medicine

## 2021-11-02 ENCOUNTER — Other Ambulatory Visit: Payer: Self-pay

## 2021-11-02 ENCOUNTER — Ambulatory Visit (INDEPENDENT_AMBULATORY_CARE_PROVIDER_SITE_OTHER): Payer: Self-pay | Admitting: Plastic Surgery

## 2021-11-02 ENCOUNTER — Encounter: Payer: Self-pay | Admitting: Plastic Surgery

## 2021-11-02 DIAGNOSIS — N62 Hypertrophy of breast: Secondary | ICD-10-CM

## 2021-11-02 NOTE — Progress Notes (Signed)
The patient is a 51 year old female here with her daughter for follow-up after breast surgery.  She had saline implants placed 25 years ago and requested for them to be removed.  She is 5 feet 4 inches tall.  We removed the implants and did a mastopexy last week.  Drain output has been very minimal.  I was able to remove the drains today.  The dressings are still in place.  No sign of a hematoma or seroma.  She can wash and just tap dry.  Continue with the sports bra.  We will see her back in 2 weeks.

## 2021-11-03 ENCOUNTER — Other Ambulatory Visit: Payer: Self-pay | Admitting: Family Medicine

## 2021-11-05 ENCOUNTER — Telehealth: Payer: Self-pay

## 2021-11-05 ENCOUNTER — Telehealth: Payer: Self-pay | Admitting: Plastic Surgery

## 2021-11-05 NOTE — Telephone Encounter (Signed)
Patient left voicemail and mentioned that yesterday she noticed it was red under her left breast and now she has noticed where her stitches are, there is a line coming down from it on the inside of her skin that looks like a wire. She said it is very painful. Her temperature has also dropped to 95 degrees but she said it is finally back to 97 degrees. She does not feel well and has began taking an antibiotic that was prescribed by another doctor. She has taken 2 doses but she is very concerned.  She would like someone to follow up and would like to know if she needs to come in and be seen.  Please follow up

## 2021-11-05 NOTE — Telephone Encounter (Signed)
See previous phone note.  

## 2021-11-05 NOTE — Telephone Encounter (Signed)
Called patient to discuss, she reports that she has noticed that her temperature has been low.  She reports that she has been feeling tired.  She also reports that she has a sore throat, she does not have any congestion or shortness of breath or chest pain.  She has not had any fevers.  She reports that she has started taking some Keflex and is going to finish this course of antibiotics.  She reports that she also notices an area of concern in her left arm extending towards her breast.  She reports it appears consistent with a "wire under her skin".  She reports no swelling in her arm.  She has normal function of her arm.  She reports that she does not want to come in for an appointment today.  I informed her to call if her symptoms worsen or change, discussed with patient if her symptoms worsen over the weekend that she may need to be evaluated in the emergency room or an urgent care depending on the severity of her symptoms.  I also offered an appointment for Monday morning on 11/08/2021 if her symptoms worsen over the weekend.  Patient reports she will call if her symptoms worsen.  She is also aware to seek medical attention if she has any changes.

## 2021-11-05 NOTE — Telephone Encounter (Signed)
Patient asked her daughter to stop in to see [place] with a message today because their phones are not working.  Patient said that under her left breast it is red and tender.  She said that there is a wire looking thing going down from that area on the left side of torso.  She said her temperature dropped to 95.9 last night and now it's 97.1 but she has to stay under a heated blanket. She said she is also feeling sick and really tired.  Patient said she found a bottle of antibiotics (Cephalexin 500mg ) and took two pills last night and one this morning.  Patient would like to know if she needs to be seen.  Please call patient first and if you can't reach her, you may be able to reach her daughter, Ahmiyah Coil at [phone].

## 2021-11-10 NOTE — Progress Notes (Signed)
Patient is a 51 year old female here for follow-up after her breast surgery with Dr. Marla Roe.  She had her bilateral breast implants removed and bilateral mastopexy the first week of January.  Patient reports that overall she is doing well in regards to her breast.  She is not having any infectious symptoms.  She is very pleased with her result.  She does report that she has some tenderness of her left abdomen and feels a thickened area extending from her lower breast into her lower abdomen.  She is unsure what this is but it is tender to palpation.  She describes it as a "wire".  Chaperone present on exam On exam bilateral NAC's are viable, bilateral breast incisions are intact, no subcutaneous fluid collection noted palpation.  Bilateral JP drain sites have healed well.  There is no erythema or cellulitic changes.  In regards to her abdomen, she has what appears to be a thickened vein/palpable cord extending from the lower breast into the lower abdomen, it is tender with palpation.  There is no overlying skin changes.  No surrounding erythema.  51 year old female here for follow-up after removal of bilateral breast implants and bilateral mastopexy.  She is doing really well.  She is having tenderness of her abdomen and what appears to be a thickened vein, palpable cord.  Recommend light massage to the area, may also benefit from some ibuprofen.  In regards to her breast, she is doing really well.  Recommend continue her compressive garments until 6 weeks after surgery.  Avoid strenuous activities until 6 weeks after surgery.  Recommend following up as needed.  Pictures were obtained of the patient and placed in the chart with the patient's or guardian's permission.

## 2021-11-12 ENCOUNTER — Other Ambulatory Visit: Payer: Self-pay

## 2021-11-12 ENCOUNTER — Ambulatory Visit (INDEPENDENT_AMBULATORY_CARE_PROVIDER_SITE_OTHER): Payer: BC Managed Care – PPO | Admitting: Surgical

## 2021-11-12 DIAGNOSIS — N62 Hypertrophy of breast: Secondary | ICD-10-CM

## 2021-11-12 DIAGNOSIS — G8929 Other chronic pain: Secondary | ICD-10-CM

## 2021-11-12 DIAGNOSIS — M546 Pain in thoracic spine: Secondary | ICD-10-CM

## 2021-11-12 DIAGNOSIS — Z9882 Breast implant status: Secondary | ICD-10-CM

## 2021-11-22 ENCOUNTER — Other Ambulatory Visit: Payer: Self-pay

## 2021-11-22 MED ORDER — AMPHETAMINE-DEXTROAMPHET ER 20 MG PO CP24: 20.0000 mg | ORAL_CAPSULE | Freq: Every day | ORAL | 0 refills | Status: DC

## 2021-11-22 NOTE — Telephone Encounter (Signed)
Caller name:Graycee Financial risk analyst callback [phone]  Encourage patient to contact the pharmacy for refills or they can request refills through [facility]  (Please schedule appointment if patient has not been seen in over a year)  MEDICATION NAME & DOSE:amphetamine-dextroamphetamine (ADDERALL XR) 20 MG 24 hr capsule [[id number]]   Notes/Comments from patient:  [place] TO: [facility] [place], [address]   Please notify patient: It takes 48-72 hours to process rx refill requests Ask patient to call pharmacy to ensure rx is ready before heading there.   (CLINICAL TO FILL OR ROUTE PER PROTOCOLS)

## 2021-11-22 NOTE — Telephone Encounter (Signed)
Patient is requesting a refill of the following medications: Requested Prescriptions   Pending Prescriptions Disp Refills   amphetamine-dextroamphetamine (ADDERALL XR) 20 MG 24 hr capsule 30 capsule 0    Sig: Take 1 capsule (20 mg total) by mouth daily.    Date of patient request: 11/22/21 Last office visit: 08/20/21 Date of last refill: 10/05/21 Last refill amount: 30

## 2021-11-25 ENCOUNTER — Other Ambulatory Visit: Payer: Self-pay | Admitting: Family Medicine

## 2021-12-21 ENCOUNTER — Telehealth: Payer: Self-pay | Admitting: Family Medicine

## 2021-12-21 MED ORDER — AMPHETAMINE-DEXTROAMPHET ER 20 MG PO CP24: 20.0000 mg | ORAL_CAPSULE | Freq: Every day | ORAL | 0 refills | Status: DC

## 2021-12-21 NOTE — Telephone Encounter (Signed)
Pt called and states that she neds a refill of her adderall xr and would like Dr Birdie Riddle to send in order to [facility] drug store [phone] [address], [place]  Please advice  Thank You.

## 2022-01-08 ENCOUNTER — Other Ambulatory Visit: Payer: Self-pay | Admitting: Family Medicine

## 2022-01-08 DIAGNOSIS — J310 Chronic rhinitis: Secondary | ICD-10-CM

## 2022-01-10 ENCOUNTER — Telehealth (INDEPENDENT_AMBULATORY_CARE_PROVIDER_SITE_OTHER): Payer: BC Managed Care – PPO | Admitting: Family Medicine

## 2022-01-10 ENCOUNTER — Encounter: Payer: Self-pay | Admitting: Family Medicine

## 2022-01-10 ENCOUNTER — Other Ambulatory Visit: Payer: Self-pay

## 2022-01-10 DIAGNOSIS — B001 Herpesviral vesicular dermatitis: Secondary | ICD-10-CM

## 2022-01-10 MED ORDER — VALACYCLOVIR HCL 1 G PO TABS: 2000.0000 mg | ORAL_TABLET | Freq: Two times a day (BID) | ORAL | 1 refills | Status: AC

## 2022-01-10 NOTE — Progress Notes (Signed)
Virtual Visit via Video Note ? ?I connected with Sheryl Nichols on 01/10/22 at  3:45 PM EDT by a video enabled telemedicine application 2/2 TMHDQ-22 pandemic and verified that I am speaking with the correct person using two identifiers. ? Location patient: home ?Location provider:work or home office ?Persons participating in the virtual visit: patient, provider ? ?I discussed the limitations of evaluation and management by telemedicine and the availability of in person appointments. The patient expressed understanding and agreed to proceed. ? ?Chief Complaint  ?Patient presents with  ? Mouth Lesions  ?  Noticed it today. Has not had anything to take   ? ? ?HPI: ?Pt endorses bump on upper lip x1 day.  Patient states area is similar to previous cold sores.  Patient has not taken anything for symptoms but wants to get on top of it before it gets worse.  Patient notes increased stress because symptoms as she has court tomorrow.  States has not had a cold sore in a while.  Requesting Valtrex. ? ? ?ROS: See pertinent positives and negatives per HPI. ? ?Past Medical History:  ?Diagnosis Date  ? ADHD (attention deficit hyperactivity disorder), combined type 04/22/2021  ? Anemia 07/31/2007  ? Iron deficiency ; onset as child pre menses; ?dietary deficiency related ( avoided meat , spinach, leafy greens,etc)  ? Asthma 08/22/2009  ? Onset:as infant Triggers (environmental, infectious, allergic):allergens ; EIB Rescue inhaler WLN:[id number] pre running Maintenance medications/ response:not used Smoking history:never Family history pulmonary disease: no  ? Carpal tunnel syndrome 06/04/2013  ? Improved with braces  ? Chronic rhinitis 09/13/2009  ? Dermatographic urticaria 09/06/2010  ? Heart murmur 07/31/2007  ? History of COVID-19 12/02/2020  ? Hyperhidrosis 02/21/2014  ? Hypothyroidism   ? Lumbar radiculopathy 02/19/2021  ? Migraines   ? resolved off sugar  ? Recurrent urinary tract infection 08/22/2019  ? ? ?Past Surgical  History:  ?Procedure Laterality Date  ? BREAST ENHANCEMENT SURGERY    ? COLONOSCOPY  2009  ? for rectal bleeding; IH found  ? SEPTOPLASTY    ? sinus cystectomy  ? TONSILLECTOMY AND ADENOIDECTOMY    ? TYMPANOPLASTY    ? WISDOM TOOTH EXTRACTION    ? ? ?Family History  ?Problem Relation Age of Onset  ? Hyperlipidemia Mother   ? Hypertension Mother   ? Heart attack Mother 94  ? Hypothyroidism Mother   ? Cancer Maternal Grandfather   ?     Brain Cancer  ? Diabetes Neg Hx   ? Stroke Neg Hx   ? Stomach cancer Neg Hx   ? Rectal cancer Neg Hx   ? Esophageal cancer Neg Hx   ? Colon cancer Neg Hx   ? Colon polyps Neg Hx   ? ? ? ?Current Outpatient Medications:  ?  albuterol (VENTOLIN HFA) 108 (90 Base) MCG/ACT inhaler, INHALE 1 TO 2 PUFFS BY MOUTH EVERY 4 TO 6 HOURS AS NEEDED FOR WHEEZING OR SHORTNESS OF BREATH, Disp: 6.7 g, Rfl: 0 ?  amphetamine-dextroamphetamine (ADDERALL XR) 20 MG 24 hr capsule, Take 1 capsule (20 mg total) by mouth daily., Disp: 30 capsule, Rfl: 0 ?  cetirizine (ZYRTEC) 10 MG tablet, TAKE 1 TABLET(10 MG) BY MOUTH DAILY, Disp: 30 tablet, Rfl: 11 ?  fluticasone (FLONASE) 50 MCG/ACT nasal spray, SHAKE LIQUID AND USE 2 SPRAYS IN EACH NOSTRIL DAILY, Disp: 16 g, Rfl: 3 ?  ID NOW COVID-19 KIT, , Disp: , Rfl:  ?  ondansetron (ZOFRAN-ODT) 4 MG disintegrating tablet, Take 1 tablet (4  mg total) by mouth every 8 (eight) hours as needed for nausea or vomiting., Disp: 20 tablet, Rfl: 0 ?  QVAR REDIHALER 80 MCG/ACT inhaler, INHALE 2 PUFFS INTO THE LUNGS TWICE DAILY, Disp: 10.6 g, Rfl: 3 ? ?EXAM: ? ?VITALS per patient if applicable: RR between 84-03 bpm ? ?GENERAL: alert, oriented, appears well and in no acute distress ? ?HEENT: Oral lesion upper lip, atraumatic, conjunctiva clear, no obvious abnormalities on inspection of external nose and ears ? ?NECK: normal movements of the head and neck ? ?LUNGS: on inspection no signs of respiratory distress, breathing rate appears normal, no obvious gross SOB, gasping or  wheezing ? ?CV: no obvious cyanosis ? ?MS: moves all visible extremities without noticeable abnormality ? ?PSYCH/NEURO: pleasant and cooperative, no obvious depression or anxiety, speech and thought processing grossly intact ? ?ASSESSMENT AND PLAN: ? ?Discussed the following assessment and plan: ? ?Herpes labialis  ?-Discussed prevention including decreasing stress ?- Plan: valACYclovir (VALTREX) 1000 MG tablet ? ?Follow-up as needed ?  ?I discussed the assessment and treatment plan with the patient. The patient was provided an opportunity to ask questions and all were answered. The patient agreed with the plan and demonstrated an understanding of the instructions. ?  ?The patient was advised to call back or seek an in-person evaluation if the symptoms worsen or if the condition fails to improve as anticipated. ? ?Billie Ruddy, MD  ? ?

## 2022-01-23 ENCOUNTER — Other Ambulatory Visit: Payer: Self-pay | Admitting: Family Medicine

## 2022-01-24 ENCOUNTER — Telehealth: Payer: Self-pay | Admitting: Family Medicine

## 2022-01-24 MED ORDER — AMPHETAMINE-DEXTROAMPHET ER 20 MG PO CP24: 20.0000 mg | ORAL_CAPSULE | Freq: Every day | ORAL | 0 refills | Status: DC

## 2022-01-24 NOTE — Telephone Encounter (Signed)
Pt called in asking for a refill on the Adderall 20 mg, pt uses [facility] [address] ?

## 2022-01-24 NOTE — Telephone Encounter (Signed)
Prescription filled at pt's request ?

## 2022-03-09 ENCOUNTER — Other Ambulatory Visit: Payer: Self-pay

## 2022-03-09 MED ORDER — AMPHETAMINE-DEXTROAMPHET ER 20 MG PO CP24: 20.0000 mg | ORAL_CAPSULE | Freq: Every day | ORAL | 0 refills | Status: DC

## 2022-03-09 NOTE — Telephone Encounter (Signed)
MEDICATION: amphetamine-dextroamphetamine (ADDERALL XR) 20 MG 24 hr capsule ? ?PHARMACY: [id number] - [place], [address] AT [address] ? ?Comments: Patient has about 3 pills left.  ? ?**Let patient know to contact pharmacy at the end of the day to make sure medication is ready. ** ? ?** Please notify patient to allow 48-72 hours to process** ? ?**Encourage patient to contact the pharmacy for refills or they can request refills through [facility]** ? ? ?

## 2022-03-09 NOTE — Telephone Encounter (Signed)
Patient is requesting a refill of the following medications: ?Requested Prescriptions  ? ?Pending Prescriptions Disp Refills  ? amphetamine-dextroamphetamine (ADDERALL XR) 20 MG 24 hr capsule 30 capsule 0  ?  Sig: Take 1 capsule (20 mg total) by mouth daily.  ? ? ?Date of patient request: 03/09/2022 ?Last office visit: 08/20/2021 video visit ?Date of last refill: 01/24/2022 ?Last refill amount: 30 capsules  ?Follow up time period per chart: none  ?

## 2022-03-30 ENCOUNTER — Other Ambulatory Visit: Payer: Self-pay

## 2022-03-30 DIAGNOSIS — J4531 Mild persistent asthma with (acute) exacerbation: Secondary | ICD-10-CM

## 2022-03-30 MED ORDER — QVAR REDIHALER 80 MCG/ACT IN AERB: 2.0000 | INHALATION_SPRAY | Freq: Two times a day (BID) | RESPIRATORY_TRACT | 3 refills | Status: DC

## 2022-04-12 ENCOUNTER — Encounter: Payer: BC Managed Care – PPO | Admitting: Family Medicine

## 2022-04-14 ENCOUNTER — Telehealth: Payer: Self-pay | Admitting: Family Medicine

## 2022-04-14 MED ORDER — AMPHETAMINE-DEXTROAMPHET ER 20 MG PO CP24: 20.0000 mg | ORAL_CAPSULE | Freq: Every day | ORAL | 0 refills | Status: DC

## 2022-04-14 NOTE — Telephone Encounter (Signed)
Medication: amphetamine-dextroamphetamine (ADDERALL XR) 20 MG 24 hr capsule   Has the patient contacted their pharmacy? Yes.     Preferred Pharmacy:  [id number] [address], [address] AT [facility] [address]   [place], [place] [address]  Phone:  [phone]  Fax:  [phone]

## 2022-04-14 NOTE — Telephone Encounter (Signed)
Prescription sent at pt's request 

## 2022-05-05 ENCOUNTER — Other Ambulatory Visit: Payer: Self-pay

## 2022-05-05 DIAGNOSIS — J310 Chronic rhinitis: Secondary | ICD-10-CM

## 2022-05-05 MED ORDER — FLUTICASONE PROPIONATE 50 MCG/ACT NA SUSP: NASAL | 3 refills | Status: DC

## 2022-05-26 ENCOUNTER — Ambulatory Visit (INDEPENDENT_AMBULATORY_CARE_PROVIDER_SITE_OTHER): Payer: BC Managed Care – PPO | Admitting: Family Medicine

## 2022-05-26 ENCOUNTER — Encounter: Payer: Self-pay | Admitting: Family Medicine

## 2022-05-26 ENCOUNTER — Telehealth: Payer: Self-pay | Admitting: Family Medicine

## 2022-05-26 VITALS — BP 118/72 | HR 78 | Temp 97.4°F | Resp 16 | Ht 64.5 in | Wt 137.1 lb

## 2022-05-26 DIAGNOSIS — Z0184 Encounter for antibody response examination: Secondary | ICD-10-CM | POA: Diagnosis not present

## 2022-05-26 DIAGNOSIS — Z0000 Encounter for general adult medical examination without abnormal findings: Secondary | ICD-10-CM | POA: Diagnosis not present

## 2022-05-26 DIAGNOSIS — H6691 Otitis media, unspecified, right ear: Secondary | ICD-10-CM | POA: Diagnosis not present

## 2022-05-26 DIAGNOSIS — E785 Hyperlipidemia, unspecified: Secondary | ICD-10-CM

## 2022-05-26 DIAGNOSIS — Z111 Encounter for screening for respiratory tuberculosis: Secondary | ICD-10-CM | POA: Diagnosis not present

## 2022-05-26 LAB — CBC WITH DIFFERENTIAL/PLATELET
Basophils Absolute: 0 10*3/uL (ref 0.0–0.1)
Basophils Relative: 0.7 % (ref 0.0–3.0)
Eosinophils Absolute: 0.1 10*3/uL (ref 0.0–0.7)
Eosinophils Relative: 2 % (ref 0.0–5.0)
HCT: 40.8 % (ref 36.0–46.0)
Hemoglobin: 13.9 g/dL (ref 12.0–15.0)
Lymphocytes Relative: 38.4 % (ref 12.0–46.0)
Lymphs Abs: 2.3 10*3/uL (ref 0.7–4.0)
MCHC: 34 g/dL (ref 30.0–36.0)
MCV: 92.1 fl (ref 78.0–100.0)
Monocytes Absolute: 0.4 10*3/uL (ref 0.1–1.0)
Monocytes Relative: 5.9 % (ref 3.0–12.0)
Neutro Abs: 3.2 10*3/uL (ref 1.4–7.7)
Neutrophils Relative %: 53 % (ref 43.0–77.0)
Platelets: 307 10*3/uL (ref 150.0–400.0)
RBC: 4.43 Mil/uL (ref 3.87–5.11)
RDW: 12.5 % (ref 11.5–15.5)
WBC: 6.1 10*3/uL (ref 4.0–10.5)

## 2022-05-26 LAB — BASIC METABOLIC PANEL
BUN: 14 mg/dL (ref 6–23)
CO2: 26 mEq/L (ref 19–32)
Calcium: 9.3 mg/dL (ref 8.4–10.5)
Chloride: 102 mEq/L (ref 96–112)
Creatinine, Ser: 0.71 mg/dL (ref 0.40–1.20)
GFR: 99.02 mL/min (ref >60.00)
Glucose, Bld: 97 mg/dL (ref 70–99)
Potassium: 3.5 mEq/L (ref 3.5–5.1)
Sodium: 138 mEq/L (ref 135–145)

## 2022-05-26 LAB — HEPATIC FUNCTION PANEL
ALT: 34 U/L (ref 0–35)
AST: 23 U/L (ref 0–37)
Albumin: 4.6 g/dL (ref 3.5–5.2)
Alkaline Phosphatase: 71 U/L (ref 39–117)
Bilirubin, Direct: 0.1 mg/dL (ref 0.0–0.3)
Total Bilirubin: 0.3 mg/dL (ref 0.2–1.2)
Total Protein: 7.6 g/dL (ref 6.0–8.3)

## 2022-05-26 LAB — LIPID PANEL
Cholesterol: 226 mg/dL — ABNORMAL HIGH (ref 0–200)
HDL: 69.1 mg/dL (ref >39.00)
LDL Cholesterol: 138 mg/dL — ABNORMAL HIGH (ref 0–99)
NonHDL: 156.94
Total CHOL/HDL Ratio: 3
Triglycerides: 94 mg/dL (ref 0.0–149.0)
VLDL: 18.8 mg/dL (ref 0.0–40.0)

## 2022-05-26 LAB — TSH: TSH: 1.12 u[IU]/mL (ref 0.35–5.50)

## 2022-05-26 MED ORDER — AMOXICILLIN 875 MG PO TABS: 875.0000 mg | ORAL_TABLET | Freq: Two times a day (BID) | ORAL | 0 refills | Status: AC

## 2022-05-26 NOTE — Progress Notes (Signed)
   Subjective:    Patient ID: Sheryl Nichols, female    DOB: [birth year], 51 y.o.   MRN: [id number]  HPI CPE- UTD on pap, colonoscopy, Tdap.  Due for mammo- pt is very hesitant due to recent breast reduction surgery Teola Bradley)  Patient Care Team    Relationship Specialty Notifications Start End  Midge Minium, MD PCP - General Family Medicine  12/20/13   Brien Few, MD Consulting Physician Obstetrics and Gynecology  06/08/18     Health Maintenance  Topic Date Due   MAMMOGRAM  10/30/2021   INFLUENZA VACCINE  05/24/2022   HIV Screening  05/27/2023 (Originally 10/21/1986)   TETANUS/TDAP  06/05/2023   PAP SMEAR-Modifier  10/31/2023   COLONOSCOPY (Pts 45-38yr Insurance coverage will need to be confirmed)  12/21/2030   COVID-19 Vaccine  Completed   HPV VACCINES  Aged Out   Hepatitis C Screening  Discontinued   Zoster Vaccines- Shingrix  Discontinued      Review of Systems Patient reports no vision/ hearing changes, adenopathy,fever, weight change,  persistant/recurrent hoarseness , swallowing issues, chest pain, palpitations, edema, persistant/recurrent cough, hemoptysis, gastrointestinal bleeding (melena, rectal bleeding), abdominal pain, significant heartburn, bowel changes, GU symptoms (dysuria, hematuria, incontinence), Gyn symptoms (abnormal  bleeding, pain),  syncope, focal weakness, memory loss, numbness & tingling, skin/hair/nail changes, abnormal bruising or bleeding, anxiety, or depression.   + SOB- asthma is worsening due to air quality + R ear pain    Objective:   Physical Exam General Appearance:    Alert, cooperative, no distress, appears stated age  Head:    Normocephalic, without obvious abnormality, atraumatic  Eyes:    PERRL, conjunctiva/corneas clear, EOM's intact, fundi    benign, both eyes  Ears:    R TM dull, opaque, visible fluid, L TM WNL  Nose:   Nares normal, septum midline, mucosa normal, no drainage    or sinus tenderness  Throat:   Lips,  mucosa, and tongue normal; teeth and gums normal  Neck:   Supple, symmetrical, trachea midline, no adenopathy;    Thyroid: no enlargement/tenderness/nodules  Back:     Symmetric, no curvature, ROM normal, no CVA tenderness  Lungs:     Clear to auscultation bilaterally, respirations unlabored  Chest Wall:    No tenderness or deformity   Heart:    Regular rate and rhythm, S1 and S2 normal, no murmur, rub   or gallop  Breast Exam:    Deferred to GYN  Abdomen:     Soft, non-tender, bowel sounds active all four quadrants,    no masses, no organomegaly  Genitalia:    Deferred to GYN  Rectal:    Extremities:   Extremities normal, atraumatic, no cyanosis or edema  Pulses:   2+ and symmetric all extremities  Skin:   Skin color, texture, turgor normal, no rashes or lesions  Lymph nodes:   Cervical, supraclavicular, and axillary nodes normal  Neurologic:   CNII-XII intact, normal strength, sensation and reflexes    throughout          Assessment & Plan:

## 2022-05-26 NOTE — Patient Instructions (Signed)
Follow up in 1 year or as needed We'll notify you of your lab results and make any changes if needed Keep up the good work on healthy diet and regular exercise- you look great! Take the Amoxicillin twice daily to treat the ear infection Call with any questions or concerns Stay Safe!  Stay Healthy! Enjoy the rest of your summer!! GOOD LUCK WITH SCHOOL!!!

## 2022-05-26 NOTE — Telephone Encounter (Signed)
Encourage patient to contact the pharmacy for refills or they can request refills through [facility]  (Please schedule appointment if patient has not been seen in over a year)    WHAT PHARMACY WOULD THEY LIKE THIS SENT TO: [facility] on [address] [phone]  MEDICATION NAME & DOSE: adderall xr 20 mg   NOTES/COMMENTS FROM PATIENT: pt states that she mentioned to Dr. Birdie Riddle that it does not seem to be as effective as it used to be. Wondering if Dr. Birdie Riddle wanted to change the dosage or continue as is.       [place] office please notify patient: It takes 48-72 hours to process rx refill requests Ask patient to call pharmacy to ensure rx is ready before heading there.

## 2022-05-27 ENCOUNTER — Telehealth: Payer: Self-pay

## 2022-05-27 MED ORDER — VALACYCLOVIR HCL 1 G PO TABS: ORAL_TABLET | ORAL | 3 refills | Status: DC

## 2022-05-27 MED ORDER — AMPHETAMINE-DEXTROAMPHET ER 20 MG PO CP24: 20.0000 mg | ORAL_CAPSULE | Freq: Every day | ORAL | 0 refills | Status: DC

## 2022-05-27 NOTE — Telephone Encounter (Signed)
Pt notes today a cold sore is starting and would like to see if you would send valacyclovir given visit yesterday.  Please advise this has been an issue in the past for her

## 2022-05-27 NOTE — Addendum Note (Signed)
Addended by: Midge Minium on: 05/27/2022 03:24 PM   Modules accepted: Orders

## 2022-05-27 NOTE — Telephone Encounter (Signed)
Prescription sent to pharmacy.

## 2022-05-29 LAB — MEASLES/MUMPS/RUBELLA IMMUNITY
Mumps IgG: 279 AU/mL
Rubella: 7.35 Index
Rubeola IgG: 240 AU/mL

## 2022-05-29 LAB — QUANTIFERON-TB GOLD PLUS
Mitogen-NIL: >10 IU/mL
NIL: 0.05 IU/mL
QuantiFERON-TB Gold Plus: NEGATIVE
TB1-NIL: 0.03 IU/mL
TB2-NIL: 0.05 IU/mL

## 2022-05-29 LAB — HEPATITIS B SURFACE ANTIBODY,QUALITATIVE: Hep B S Ab: REACTIVE — AB

## 2022-05-29 NOTE — Assessment & Plan Note (Signed)
Pt's PE WNL w/ exception of R OM.  Start Amoxicillin to treat infxn.  UTD on pap, colonoscopy, Tdap.  She is due for mammo but very hesitant after recent breast reduction.  She prefers to have either [place] or MRI- will call [facility] and determine what options are available.  Check labs.  Anticipatory guidance provided.

## 2022-05-30 ENCOUNTER — Telehealth: Payer: Self-pay

## 2022-05-30 NOTE — Telephone Encounter (Signed)
Informed pt of lab results  

## 2022-05-30 NOTE — Telephone Encounter (Signed)
-----  Message from Midge Minium, MD sent at 05/29/2022  5:09 PM EDT ----- Immune to hepatitis B and MMR.  Waiting on remaining results

## 2022-06-01 LAB — POLIOVIRUS (1,3) ABS, NEUTRALIZ.
Polio 1 Titer: >1:128 {titer}
Polio 3 Titer: 1:64 {titer}

## 2022-06-02 ENCOUNTER — Telehealth: Payer: Self-pay | Admitting: Family Medicine

## 2022-06-02 NOTE — Telephone Encounter (Signed)
Forms given to [facility] to print titers and return to pt

## 2022-06-02 NOTE — Telephone Encounter (Signed)
Caller name: Nicha Hemann   On DPR? :yes/no: Yes  Call back number: [phone]  Provider they see: Birdie Riddle   Reason for call:  pt is returning a phone call from yesterday

## 2022-06-02 NOTE — Telephone Encounter (Signed)
I have attempted to reach pt unable to leave VM it is full and no answer . Called her emergency contact [place] as well no answer and VM is full. Left form along with results up front in filing cabniet for pt to pickup . Copy of form was made and placed in scan

## 2022-06-02 NOTE — Telephone Encounter (Signed)
I called the patient about her Labs and she want to know if everything was done can she have a call when her forms are available for pick up. Please advise

## 2022-06-15 ENCOUNTER — Telehealth: Payer: Self-pay | Admitting: Family Medicine

## 2022-06-15 NOTE — Telephone Encounter (Signed)
Pt needs new visit may be recurring infection but we will need to be able to examine her

## 2022-06-15 NOTE — Telephone Encounter (Signed)
Caller name: Hope Pigeon   On DPR? :yes/no: Yes  Call back number:[phone]  Provider they see: Birdie Riddle   Reason for call:  Pt called stating that she had a ear infection about 3 weeks ago. Pt was prescribe antibiotic for the ear infection, that help with the ear infection. Pt states now she can't really hear that well out of her right ear.     Pt has an appt on 06/20/22.

## 2022-06-15 NOTE — Telephone Encounter (Signed)
Pt called about her forms. Pt is aware that the forms are completed. Pt states that she will pick up forms today .

## 2022-06-15 NOTE — Telephone Encounter (Signed)
Scheduled

## 2022-06-20 ENCOUNTER — Ambulatory Visit (INDEPENDENT_AMBULATORY_CARE_PROVIDER_SITE_OTHER): Payer: BC Managed Care – PPO | Admitting: Family Medicine

## 2022-06-20 ENCOUNTER — Encounter: Payer: Self-pay | Admitting: Family Medicine

## 2022-06-20 ENCOUNTER — Telehealth: Payer: Self-pay | Admitting: Family Medicine

## 2022-06-20 VITALS — BP 122/70 | HR 81 | Temp 98.8°F | Resp 16 | Ht 64.5 in | Wt 139.0 lb

## 2022-06-20 DIAGNOSIS — H9193 Unspecified hearing loss, bilateral: Secondary | ICD-10-CM | POA: Diagnosis not present

## 2022-06-20 DIAGNOSIS — Z23 Encounter for immunization: Secondary | ICD-10-CM | POA: Diagnosis not present

## 2022-06-20 DIAGNOSIS — F902 Attention-deficit hyperactivity disorder, combined type: Secondary | ICD-10-CM | POA: Diagnosis not present

## 2022-06-20 DIAGNOSIS — H938X1 Other specified disorders of right ear: Secondary | ICD-10-CM | POA: Diagnosis not present

## 2022-06-20 MED ORDER — OFLOXACIN 0.3 % OT SOLN: 10.0000 [drp] | Freq: Every day | OTIC | 0 refills | Status: DC

## 2022-06-20 MED ORDER — AMPHETAMINE-DEXTROAMPHET ER 25 MG PO CP24: 25.0000 mg | ORAL_CAPSULE | ORAL | 0 refills | Status: DC

## 2022-06-20 MED ORDER — CORTISPORIN-TC 3.3-3-10-0.5 MG/ML OT SUSP: 4.0000 [drp] | Freq: Three times a day (TID) | OTIC | 0 refills | Status: DC

## 2022-06-20 NOTE — Patient Instructions (Signed)
Follow up as needed or as scheduled We'll call you with your ENT appt CONTINUE your Claritin or Zyrtec daily Make sure you are using Flonase daily- 2 sprays each nostril START the ear drops-4 drops 3x/day- for next 5 days Drink LOTS of water INCREASE the Adderall to '25mg'$  daily.  New prescription sent Call with any questions or concerns Hang in there!!!

## 2022-06-20 NOTE — Telephone Encounter (Signed)
New prescription for Ofloxacin drops sent to pharmacy

## 2022-06-20 NOTE — Progress Notes (Signed)
   Subjective:    Patient ID: Sheryl Nichols, female    DOB: [birth year], 51 y.o.   MRN: [id number]  HPI Ear pressure- pt moved daughter into App last week and since then has had R ear fullness and pressure.  She attempted to use olive oil which made things worse.  Tried OTC ear wax drops w/o relief.  No drainage.  'it feels underwater'.  Has dizziness, buzzing in R ear.  Using Flonase.  ADHD- pt feels medication is not effective and having a hard time with her classes.  Currently on Adderall XR '20mg'$  daily.   Review of Systems For ROS see HPI     Objective:   Physical Exam Vitals reviewed.  Constitutional:      General: She is not in acute distress.    Appearance: Normal appearance. She is not ill-appearing.  HENT:     Head: Normocephalic and atraumatic.     Right Ear: Ear canal normal.     Left Ear: Ear canal normal.     Ears:     Comments: TMs w/ excessive scarring bilaterally.  There is a round area of R inferior TM that may represent perforation vs previous scar.   Musculoskeletal:     Cervical back: Normal range of motion and neck supple.  Lymphadenopathy:     Cervical: No cervical adenopathy.  Skin:    General: Skin is warm and dry.  Neurological:     General: No focal deficit present.     Mental Status: She is alert and oriented to person, place, and time.  Psychiatric:        Mood and Affect: Mood normal.        Behavior: Behavior normal.        Thought Content: Thought content normal.           Assessment & Plan:   R ear fullness/pain- new.  Started last week while moving daughter into [place].  Thought it was just pressure due to elevation but sxs have not improved/resolved- only worsened.  She attempted to use olive oil in he ear and that was 'excrutiating'.  Decreased hearing bilaterally.  Pt is worried that she perforated her ear drum as this feels similar to previous perforations.  Given her pain and concern for perforation, will start Ofloxacin drops and  refer to ENT for complete evaluation.  Pt expressed understanding and is in agreement w/ plan.

## 2022-06-20 NOTE — Telephone Encounter (Signed)
Caller name: Juliann Pulse  On DPR? :yes/no: Yes  Call back number: [phone]  Provider they see: Birdie Riddle  Reason for call: pt calling b/c neomycin-colist-hc thonzonium 3.12-24-08-0.5 mg/ml is too expensive (pt states $200 out of pocket); is there another recommendation for her? Pls advise.

## 2022-06-20 NOTE — Assessment & Plan Note (Signed)
Deteriorated.  Since starting her semester, pt doesn't feel that her medication is adequately controlling her sxs.  Will increase Adderall XR to '25mg'$  daily and monitor for improvement.  Pt expressed understanding and is in agreement w/ plan.

## 2022-06-30 ENCOUNTER — Other Ambulatory Visit: Payer: Self-pay

## 2022-06-30 DIAGNOSIS — J4531 Mild persistent asthma with (acute) exacerbation: Secondary | ICD-10-CM

## 2022-06-30 MED ORDER — QVAR REDIHALER 80 MCG/ACT IN AERB: 2.0000 | INHALATION_SPRAY | Freq: Two times a day (BID) | RESPIRATORY_TRACT | 3 refills | Status: DC

## 2022-07-25 DIAGNOSIS — H6240 Otitis externa in other diseases classified elsewhere, unspecified ear: Secondary | ICD-10-CM | POA: Diagnosis not present

## 2022-07-25 DIAGNOSIS — H9011 Conductive hearing loss, unilateral, right ear, with unrestricted hearing on the contralateral side: Secondary | ICD-10-CM | POA: Diagnosis not present

## 2022-07-25 DIAGNOSIS — H722X1 Other marginal perforations of tympanic membrane, right ear: Secondary | ICD-10-CM | POA: Diagnosis not present

## 2022-07-25 DIAGNOSIS — Z9889 Other specified postprocedural states: Secondary | ICD-10-CM | POA: Diagnosis not present

## 2022-07-25 DIAGNOSIS — B488 Other specified mycoses: Secondary | ICD-10-CM | POA: Diagnosis not present

## 2022-07-29 DIAGNOSIS — H6240 Otitis externa in other diseases classified elsewhere, unspecified ear: Secondary | ICD-10-CM | POA: Diagnosis not present

## 2022-07-29 DIAGNOSIS — B369 Superficial mycosis, unspecified: Secondary | ICD-10-CM | POA: Diagnosis not present

## 2022-07-29 DIAGNOSIS — H722X1 Other marginal perforations of tympanic membrane, right ear: Secondary | ICD-10-CM | POA: Diagnosis not present

## 2022-08-05 DIAGNOSIS — H7291 Unspecified perforation of tympanic membrane, right ear: Secondary | ICD-10-CM | POA: Diagnosis not present

## 2022-08-17 ENCOUNTER — Other Ambulatory Visit: Payer: Self-pay

## 2022-08-17 NOTE — Telephone Encounter (Signed)
Patient is requesting a refill of the following medications: Requested Prescriptions   Pending Prescriptions Disp Refills   amphetamine-dextroamphetamine (ADDERALL XR) 25 MG 24 hr capsule 30 capsule 0    Sig: Take 1 capsule by mouth every morning.    Date of patient request: 08/17/22 Last office visit: 06/17/22 Date of last refill: 06/20/22 Last refill amount: 30

## 2022-08-18 MED ORDER — AMPHETAMINE-DEXTROAMPHET ER 25 MG PO CP24: 25.0000 mg | ORAL_CAPSULE | ORAL | 0 refills | Status: DC

## 2022-09-14 ENCOUNTER — Other Ambulatory Visit: Payer: Self-pay | Admitting: Family Medicine

## 2022-09-14 MED ORDER — AMPHETAMINE-DEXTROAMPHET ER 25 MG PO CP24: 25.0000 mg | ORAL_CAPSULE | ORAL | 0 refills | Status: DC

## 2022-09-14 NOTE — Telephone Encounter (Signed)
Informed pt that her Rx has been sent to pharmacy

## 2022-09-14 NOTE — Telephone Encounter (Signed)
Adderall 25 mg LOV: 06/20/22 Last Refill:08/18/22 Upcoming appt: 06/09/23

## 2022-09-14 NOTE — Telephone Encounter (Signed)
Encourage patient to contact the pharmacy for refills or they can request refills through [facility]  (Please schedule appointment if patient has not been seen in over a year)    WHAT PHARMACY WOULD THEY LIKE THIS SENT TO: [id number] - [place], [address] AT [address]: adderall xr 25 mg   NOTES/COMMENTS FROM PATIENT: Pt called stated that she needs a refill on this medication.     [place] office please notify patient: It takes 48-72 hours to process rx refill requests Ask patient to call pharmacy to ensure rx is ready before heading there.

## 2022-10-12 ENCOUNTER — Other Ambulatory Visit: Payer: Self-pay

## 2022-10-12 MED ORDER — AMPHETAMINE-DEXTROAMPHET ER 25 MG PO CP24: 25.0000 mg | ORAL_CAPSULE | ORAL | 0 refills | Status: DC

## 2022-10-12 NOTE — Telephone Encounter (Signed)
Patient is requesting a refill of the following medications: Requested Prescriptions   Pending Prescriptions Disp Refills   amphetamine-dextroamphetamine (ADDERALL XR) 25 MG 24 hr capsule 30 capsule 0    Sig: Take 1 capsule by mouth every morning.    Date of patient request: 10/12/22 Last office visit: 06/20/22 Date of last refill: 09/14/22 Last refill amount: 30 Follow up time period per chart: 6 months

## 2022-10-27 DIAGNOSIS — H9011 Conductive hearing loss, unilateral, right ear, with unrestricted hearing on the contralateral side: Secondary | ICD-10-CM | POA: Diagnosis not present

## 2022-10-27 DIAGNOSIS — Z8669 Personal history of other diseases of the nervous system and sense organs: Secondary | ICD-10-CM | POA: Diagnosis not present

## 2022-10-27 DIAGNOSIS — H7291 Unspecified perforation of tympanic membrane, right ear: Secondary | ICD-10-CM | POA: Diagnosis not present

## 2023-01-06 ENCOUNTER — Other Ambulatory Visit: Payer: Self-pay

## 2023-01-06 DIAGNOSIS — J310 Chronic rhinitis: Secondary | ICD-10-CM

## 2023-01-06 MED ORDER — FLUTICASONE PROPIONATE 50 MCG/ACT NA SUSP: NASAL | 3 refills | Status: DC

## 2023-01-25 ENCOUNTER — Other Ambulatory Visit: Payer: Self-pay | Admitting: Family Medicine

## 2023-01-25 MED ORDER — AMPHETAMINE-DEXTROAMPHET ER 25 MG PO CP24: 25.0000 mg | ORAL_CAPSULE | ORAL | 0 refills | Status: DC

## 2023-01-25 NOTE — Telephone Encounter (Signed)
Patient is requesting a refill of the following medications: Requested Prescriptions   Pending Prescriptions Disp Refills   amphetamine-dextroamphetamine (ADDERALL XR) 25 MG 24 hr capsule 30 capsule 0    Sig: Take 1 capsule by mouth every morning.    Date of patient request: 01/25/23 Last office visit: 06/20/22 Date of last refill: 10/12/22 Last refill amount: 30

## 2023-01-25 NOTE — Telephone Encounter (Signed)
Encourage patient to contact the pharmacy for refills or they can request refills through [facility]:  [place], [address] AT [place] NAME & DOSE: amphetamine-dextroamphetamine (ADDERALL XR) 25 MG 24 hr capsule   NOTES/COMMENTS FROM PATIENT:      [place] office please notify patient: It takes 48-72 hours to process rx refill requests Ask patient to call pharmacy to ensure rx is ready before heading there.

## 2023-02-21 ENCOUNTER — Other Ambulatory Visit: Payer: Self-pay | Admitting: Family Medicine

## 2023-02-21 ENCOUNTER — Ambulatory Visit (INDEPENDENT_AMBULATORY_CARE_PROVIDER_SITE_OTHER): Payer: Medicaid Other | Admitting: Family Medicine

## 2023-02-21 ENCOUNTER — Encounter: Payer: Self-pay | Admitting: Family Medicine

## 2023-02-21 VITALS — BP 138/80 | HR 83 | Temp 97.8°F | Resp 16 | Ht 64.5 in | Wt 151.2 lb

## 2023-02-21 DIAGNOSIS — R29898 Other symptoms and signs involving the musculoskeletal system: Secondary | ICD-10-CM

## 2023-02-21 DIAGNOSIS — M79644 Pain in right finger(s): Secondary | ICD-10-CM | POA: Diagnosis not present

## 2023-02-21 DIAGNOSIS — M25521 Pain in right elbow: Secondary | ICD-10-CM | POA: Diagnosis not present

## 2023-02-21 MED ORDER — PREDNISONE 10 MG PO TABS: ORAL_TABLET | ORAL | 0 refills | Status: DC

## 2023-02-21 NOTE — Progress Notes (Signed)
   Subjective:    Patient ID: Sheryl Nichols, female    DOB: August 08, 1971, 52 y.o.   MRN: [id number]  HPI R finger pain- R index finger and middle finger but more index finger.  Sxs started 2 weeks ago.  Was unable to straighten finger until last night.  Pain will radiate up R arm to elbow.  Pain started after planting her garden.  Able to flex fingers.  Pt has not taken any pain medication b/c she was afraid of masking pain.    R elbow pain- sxs started after she developed finger pain.  Pain will keep her up at night.  Can have elbow pain independent of finger pain but will always have elbow pain if finger is hurting.      Review of Systems For ROS see HPI     Objective:   Physical Exam Vitals reviewed.  Constitutional:      Appearance: Normal appearance.  HENT:     Head: Normocephalic and atraumatic.  Cardiovascular:     Pulses: Normal pulses.  Musculoskeletal:        General: Tenderness (TTP over L olecranon) present.  Skin:    General: Skin is warm and dry.  Neurological:     General: No focal deficit present.     Mental Status: She is alert and oriented to person, place, and time.     Motor: Weakness (R index finger extensor weakness) present.     Deep Tendon Reflexes: Reflexes normal.  Psychiatric:        Mood and Affect: Mood normal.        Behavior: Behavior normal.        Thought Content: Thought content normal.           Assessment & Plan:  Finger pain/weakness- new.  Pt reports sxs started after planting her garden 2 weeks ago.  Up until last night she indicates she was not able to extend her R index finger.  Is now able to do so, but it takes more effort that the other fingers and still is weak in comparison.  Pain will shoot up into forearm to elbow.  Start Prednisone to improve pain and inflammation that likely resulted from overuse while planting the garden.  If no improvement, pt will see hand specialist.  Pt expressed understanding and is in agreement w/  plan.   Elbow pain- new.  Likely overuse.  Will start Prednisone as above.  If no improvement will see Ortho.  Pt expressed understanding and is in agreement w/ plan.

## 2023-02-21 NOTE — Patient Instructions (Signed)
Follow up as needed or as scheduled START the Prednisone as directed- 3 pills at the same time x3 days, then 2 pills at the same time x3 days, then 1 pill daily.  Take w/ food  Take the gabapentin as needed for pain IF the symptoms improve, you can cancel the hand appt Call with any questions or concerns Hang in there!!!

## 2023-02-28 DIAGNOSIS — H7291 Unspecified perforation of tympanic membrane, right ear: Secondary | ICD-10-CM | POA: Diagnosis not present

## 2023-02-28 DIAGNOSIS — Z9889 Other specified postprocedural states: Secondary | ICD-10-CM

## 2023-02-28 DIAGNOSIS — H7201 Central perforation of tympanic membrane, right ear: Secondary | ICD-10-CM | POA: Diagnosis not present

## 2023-02-28 HISTORY — DX: Other specified postprocedural states: Z98.890

## 2023-03-07 ENCOUNTER — Telehealth: Payer: Self-pay | Admitting: Family Medicine

## 2023-03-07 ENCOUNTER — Other Ambulatory Visit: Payer: Self-pay

## 2023-03-07 DIAGNOSIS — J4531 Mild persistent asthma with (acute) exacerbation: Secondary | ICD-10-CM

## 2023-03-07 MED ORDER — QVAR REDIHALER 80 MCG/ACT IN AERB: 2.0000 | INHALATION_SPRAY | Freq: Two times a day (BID) | RESPIRATORY_TRACT | 3 refills | Status: DC

## 2023-03-07 NOTE — Telephone Encounter (Signed)
Refill sent in and left pt a VM

## 2023-03-07 NOTE — Telephone Encounter (Signed)
Encourage patient to contact the pharmacy for refills or they can request refills through [facility]   WHAT PHARMACY WOULD THEY LIKE THIS SENT TO:  [id number] - [place], [place] - [address] [[id number]]    MEDICATION NAME & DOSE: beclomethasone (QVAR REDIHALER) 80 MCG/ACT inhaler  NOTES/COMMENTS FROM PATIENT:   Front office please notify patient: It takes 48-72 hours to process rx refill requests Ask patient to call pharmacy to ensure rx is ready before heading there.

## 2023-03-14 DIAGNOSIS — Z9889 Other specified postprocedural states: Secondary | ICD-10-CM | POA: Diagnosis not present

## 2023-03-14 DIAGNOSIS — H722X1 Other marginal perforations of tympanic membrane, right ear: Secondary | ICD-10-CM | POA: Diagnosis not present

## 2023-03-14 DIAGNOSIS — H9011 Conductive hearing loss, unilateral, right ear, with unrestricted hearing on the contralateral side: Secondary | ICD-10-CM | POA: Diagnosis not present

## 2023-03-16 ENCOUNTER — Telehealth: Payer: Self-pay

## 2023-03-16 MED ORDER — ARNUITY ELLIPTA 100 MCG/ACT IN AEPB: 1.0000 | INHALATION_SPRAY | Freq: Every day | RESPIRATORY_TRACT | 6 refills | Status: DC

## 2023-03-16 NOTE — Telephone Encounter (Signed)
Prescription changed and sent as requested.

## 2023-03-16 NOTE — Telephone Encounter (Signed)
Pharmacy is asking if we can change pt Rx Qvar to Fluticasone Ar MCG ? Due to insurance

## 2023-03-23 ENCOUNTER — Telehealth: Payer: Self-pay

## 2023-03-23 NOTE — Telephone Encounter (Signed)
PA initiated via Covermymeds; KEY: [id number].   PA approved.

## 2023-03-23 NOTE — Telephone Encounter (Signed)
Informed pt of the approval and she is contacting the pharmacy to have it filled

## 2023-03-27 LAB — HM MAMMOGRAPHY

## 2023-04-03 ENCOUNTER — Other Ambulatory Visit: Payer: Self-pay | Admitting: Family Medicine

## 2023-04-03 MED ORDER — AMPHETAMINE-DEXTROAMPHET ER 25 MG PO CP24: 25.0000 mg | ORAL_CAPSULE | ORAL | 0 refills | Status: DC

## 2023-04-03 NOTE — Telephone Encounter (Signed)
Adderall XR 25 mg LOV: 02/21/23 Last Refill:02/21/23 Upcoming appt: 05/30/23  [facility]

## 2023-04-03 NOTE — Telephone Encounter (Signed)
Medication Refill Request  Medication: amphetamine-dextroamphetamine (ADDERALL XR) 25 MG 24 hr capsule   Pharmacy: [id number] - [place], [place] - [address]   Last Visit: 4.30.24  Next Visit: 8.6.24

## 2023-04-03 NOTE — Telephone Encounter (Signed)
Pt aware of refill.

## 2023-04-06 DIAGNOSIS — H9011 Conductive hearing loss, unilateral, right ear, with unrestricted hearing on the contralateral side: Secondary | ICD-10-CM | POA: Diagnosis not present

## 2023-04-06 DIAGNOSIS — Z9889 Other specified postprocedural states: Secondary | ICD-10-CM | POA: Diagnosis not present

## 2023-04-06 DIAGNOSIS — H722X1 Other marginal perforations of tympanic membrane, right ear: Secondary | ICD-10-CM | POA: Diagnosis not present

## 2023-04-06 LAB — HM PAP SMEAR
HM Pap smear: NEGATIVE
HPV, high-risk: NEGATIVE

## 2023-04-25 ENCOUNTER — Ambulatory Visit: Payer: Medicaid Other | Admitting: Family Medicine

## 2023-04-28 ENCOUNTER — Other Ambulatory Visit: Payer: Self-pay

## 2023-04-28 DIAGNOSIS — F902 Attention-deficit hyperactivity disorder, combined type: Secondary | ICD-10-CM

## 2023-04-28 MED ORDER — AMPHETAMINE-DEXTROAMPHET ER 25 MG PO CP24: 25.0000 mg | ORAL_CAPSULE | ORAL | 0 refills | Status: DC

## 2023-04-28 NOTE — Telephone Encounter (Signed)
LOV for ADHD: 05/31/2022 Last refill: 04/03/2023 #30 0 refills Next appt: 05/30/2023

## 2023-05-01 ENCOUNTER — Telehealth: Payer: Self-pay

## 2023-05-01 NOTE — Telephone Encounter (Signed)
Chart review completed for patient. Patient is due for screening mammogram. Mychart message sent to patient to inquire about scheduling mammogram.  Winfield Caba, Population Health Specialist.  

## 2023-05-05 ENCOUNTER — Ambulatory Visit: Payer: BC Managed Care – PPO | Admitting: Family Medicine

## 2023-05-05 ENCOUNTER — Encounter: Payer: Self-pay | Admitting: Family Medicine

## 2023-05-05 VITALS — BP 114/70 | HR 71 | Temp 98.2°F | Resp 18 | Ht 64.5 in | Wt 159.4 lb

## 2023-05-05 DIAGNOSIS — R140 Abdominal distension (gaseous): Secondary | ICD-10-CM | POA: Diagnosis not present

## 2023-05-05 DIAGNOSIS — K5900 Constipation, unspecified: Secondary | ICD-10-CM | POA: Diagnosis not present

## 2023-05-05 DIAGNOSIS — R1011 Right upper quadrant pain: Secondary | ICD-10-CM

## 2023-05-05 DIAGNOSIS — R5383 Other fatigue: Secondary | ICD-10-CM | POA: Diagnosis not present

## 2023-05-05 LAB — CBC WITH DIFFERENTIAL/PLATELET
Basophils Absolute: 0 10*3/uL (ref 0.0–0.1)
Basophils Relative: 0.5 % (ref 0.0–3.0)
Eosinophils Absolute: 0.1 10*3/uL (ref 0.0–0.7)
Eosinophils Relative: 0.9 % (ref 0.0–5.0)
HCT: 38.1 % (ref 36.0–46.0)
Hemoglobin: 12.8 g/dL (ref 12.0–15.0)
Lymphocytes Relative: 34.1 % (ref 12.0–46.0)
Lymphs Abs: 2.1 10*3/uL (ref 0.7–4.0)
MCHC: 33.6 g/dL (ref 30.0–36.0)
MCV: 93.4 fl (ref 78.0–100.0)
Monocytes Absolute: 0.4 10*3/uL (ref 0.1–1.0)
Monocytes Relative: 7.1 % (ref 3.0–12.0)
Neutro Abs: 3.6 10*3/uL (ref 1.4–7.7)
Neutrophils Relative %: 57.4 % (ref 43.0–77.0)
Platelets: 303 10*3/uL (ref 150.0–400.0)
RBC: 4.08 Mil/uL (ref 3.87–5.11)
RDW: 13.4 % (ref 11.5–15.5)
WBC: 6.2 10*3/uL (ref 4.0–10.5)

## 2023-05-05 LAB — BASIC METABOLIC PANEL
BUN: 18 mg/dL (ref 6–23)
CO2: 26 mEq/L (ref 19–32)
Calcium: 9.8 mg/dL (ref 8.4–10.5)
Chloride: 105 mEq/L (ref 96–112)
Creatinine, Ser: 0.7 mg/dL (ref 0.40–1.20)
GFR: 100.06 mL/min (ref >60.00)
Glucose, Bld: 85 mg/dL (ref 70–99)
Potassium: 3.9 mEq/L (ref 3.5–5.1)
Sodium: 138 mEq/L (ref 135–145)

## 2023-05-05 LAB — VITAMIN D 25 HYDROXY (VIT D DEFICIENCY, FRACTURES): VITD: 18.72 ng/mL — ABNORMAL LOW (ref 30.00–100.00)

## 2023-05-05 LAB — TSH: TSH: 1.31 u[IU]/mL (ref 0.35–5.50)

## 2023-05-05 LAB — HEPATIC FUNCTION PANEL
ALT: 34 U/L (ref 0–35)
AST: 25 U/L (ref 0–37)
Albumin: 4.3 g/dL (ref 3.5–5.2)
Alkaline Phosphatase: 52 U/L (ref 39–117)
Bilirubin, Direct: 0.1 mg/dL (ref 0.0–0.3)
Total Bilirubin: 0.4 mg/dL (ref 0.2–1.2)
Total Protein: 7 g/dL (ref 6.0–8.3)

## 2023-05-05 LAB — LIPASE: Lipase: 26 U/L (ref 11.0–59.0)

## 2023-05-05 LAB — B12 AND FOLATE PANEL
Folate: 8.6 ng/mL (ref >5.9)
Vitamin B-12: 357 pg/mL (ref 211–911)

## 2023-05-05 LAB — AMYLASE: Amylase: 37 U/L (ref 27–131)

## 2023-05-05 MED ORDER — CETIRIZINE HCL 10 MG PO TABS: 10.0000 mg | ORAL_TABLET | Freq: Every day | ORAL | 11 refills | Status: DC

## 2023-05-05 NOTE — Progress Notes (Signed)
   Subjective:    Patient ID: Sheryl Nichols, female    DOB: 03/25/1971, 52 y.o.   MRN: [id number]  HPI Abd pain and bloating- pt reports getting extremely constipated after ear surgery in early May.  Now has random pain in R upper quadrant.  Pain is not related to eating.  Pt reports taking Miralax but when she does stool, stools are thin.  She reports decreased appetite and low energy.  + weight gain.   Review of Systems For ROS see HPI     Objective:   Physical Exam Vitals reviewed.  Constitutional:      General: She is not in acute distress.    Appearance: She is well-developed. She is not ill-appearing.  HENT:     Head: Normocephalic and atraumatic.  Eyes:     General: No scleral icterus.    Extraocular Movements: Extraocular movements intact.  Cardiovascular:     Rate and Rhythm: Normal rate and regular rhythm.  Pulmonary:     Effort: Pulmonary effort is normal. No respiratory distress.     Breath sounds: Normal breath sounds. No wheezing or rhonchi.  Abdominal:     General: Abdomen is flat. Bowel sounds are normal.     Palpations: Abdomen is soft.     Tenderness: There is no abdominal tenderness. There is no guarding or rebound. Negative signs include Murphy's sign and McBurney's sign.  Skin:    General: Skin is warm and dry.  Neurological:     General: No focal deficit present.     Mental Status: She is alert and oriented to person, place, and time.  Psychiatric:        Mood and Affect: Mood normal.        Behavior: Behavior normal.           Assessment & Plan:  Abd pain/bloating/constipation- new.  Pt had severe issues w/ constipation after surgery in May.  Reports she will have intermittent RUQ pain that does not seem to follow a particular pattern.  She is asymptomatic in office today.  Discussed that the colon makes a 90 degree turn in that area and if stools are hard, it can be painful/uncomfortable.  Will get labs to r/o infxn, biliary abnormality.   Encouraged daily Miralax to try and get bowels to move more regularly.  If sxs don't improve, she is to let me know so we can refer to GI for complete evaluation.  Fatigue- new.  She has had a hard time getting back to her pre-surgery energy level.  Initially thought it was lingering effects of anesthesia but has been going on too long at this point.  Will check labs to r/o underlying cause but suspect her fatigue is due mostly to the physical and emotional stress she has been under.  Will follow.

## 2023-05-05 NOTE — Patient Instructions (Addendum)
Follow up as needed or as scheduled We'll notify you of your lab results and make any changes if needed TAKE the Miralax daily until having regular bowel movements If no improvement in stools or bloating- let me know and we'll refer you back to GI Call with any questions or concerns Hang in there!!!

## 2023-05-07 ENCOUNTER — Other Ambulatory Visit: Payer: Self-pay | Admitting: Family Medicine

## 2023-05-07 DIAGNOSIS — J310 Chronic rhinitis: Secondary | ICD-10-CM

## 2023-05-08 ENCOUNTER — Telehealth: Payer: Self-pay

## 2023-05-08 ENCOUNTER — Telehealth: Payer: Self-pay | Admitting: Family Medicine

## 2023-05-08 ENCOUNTER — Other Ambulatory Visit: Payer: Self-pay

## 2023-05-08 DIAGNOSIS — E559 Vitamin D deficiency, unspecified: Secondary | ICD-10-CM

## 2023-05-08 MED ORDER — VITAMIN D (ERGOCALCIFEROL) 1.25 MG (50000 UNIT) PO CAPS
50000.0000 [IU] | ORAL_CAPSULE | ORAL | 12 refills | Status: DC
Start: 2023-05-08 — End: 2024-09-04

## 2023-05-08 NOTE — Telephone Encounter (Signed)
Left pt a Vm in regards to lab results . Vit D 50,000 units has been sent in

## 2023-05-08 NOTE — Telephone Encounter (Signed)
-----   Message from Neena Rhymes sent at 05/08/2023  7:25 AM EDT ----- Labs look great w/ exception of low Vit D.  Based on this, we need to start 50,000 units weekly x12 weeks in addition to daily OTC supplement of at least 2000 units.

## 2023-05-09 ENCOUNTER — Ambulatory Visit (INDEPENDENT_AMBULATORY_CARE_PROVIDER_SITE_OTHER): Payer: BC Managed Care – PPO | Admitting: Family Medicine

## 2023-05-09 ENCOUNTER — Encounter: Payer: Self-pay | Admitting: Family Medicine

## 2023-05-09 VITALS — BP 110/64 | HR 90 | Temp 97.8°F | Ht 64.5 in | Wt 158.0 lb

## 2023-05-09 DIAGNOSIS — R319 Hematuria, unspecified: Secondary | ICD-10-CM

## 2023-05-09 DIAGNOSIS — R3915 Urgency of urination: Secondary | ICD-10-CM | POA: Diagnosis not present

## 2023-05-09 DIAGNOSIS — R309 Painful micturition, unspecified: Secondary | ICD-10-CM | POA: Diagnosis not present

## 2023-05-09 DIAGNOSIS — R3916 Straining to void: Secondary | ICD-10-CM

## 2023-05-09 LAB — POCT URINALYSIS DIPSTICK
Bilirubin, UA: NEGATIVE
Clarity, UA: NEGATIVE
Glucose, UA: NEGATIVE
Ketones, UA: NEGATIVE
Leukocytes, UA: NEGATIVE
Nitrite, UA: NEGATIVE
Protein, UA: NEGATIVE
Spec Grav, UA: 1.025 (ref 1.010–1.025)
Urobilinogen, UA: 0.2 E.U./dL
pH, UA: 6 (ref 5.0–8.0)

## 2023-05-09 NOTE — Progress Notes (Signed)
Acute Office Visit   Subjective:  Patient ID: Sheryl Nichols, female    DOB: 1971-06-10, 52 y.o.   MRN: [id number]  Chief Complaint  Patient presents with   Dysuria    Pt is here today with sx of UTI Pt reports she has pain with urination Sx started 05/05/2023 Pt reports no OTC   HPI:  Patient is a 52 year old female that presents with dysuria, and she is unsure if she able to hold as much urine as previously.   Usually has a urgency to urinate, but no change in dysuria. She reports she is having to push hard to urinate.   Denies hematuria, lower abd pain, lower back pain, nausea, vomiting, or fever.   Symptoms started on 05/05/2023.   [facility] and was having a procedure where she was having her urethra expanded.     Review of Systems  Genitourinary:  Positive for dysuria.   See HPI above      Objective:   BP 110/64   Pulse 90   Temp 97.8 F (36.6 C)   Ht 5' 4.5" (1.638 m)   Wt 158 lb (71.7 kg)   SpO2 100%   BMI 26.70 kg/m    Physical Exam Vitals reviewed.  Constitutional:      General: She is not in acute distress.    Appearance: Normal appearance. She is not ill-appearing, toxic-appearing or diaphoretic.  HENT:     Head: Normocephalic and atraumatic.  Eyes:     General:        Right eye: No discharge.        Left eye: No discharge.     Conjunctiva/sclera: Conjunctivae normal.  Cardiovascular:     Rate and Rhythm: Normal rate and regular rhythm.     Heart sounds: Normal heart sounds. No murmur heard.    No friction rub. No gallop.  Pulmonary:     Effort: Pulmonary effort is normal. No respiratory distress.     Breath sounds: Normal breath sounds.  Abdominal:     General: Bowel sounds are normal.     Palpations: Abdomen is soft.     Tenderness: There is no right CVA tenderness or left CVA tenderness.  Musculoskeletal:        General: Normal range of motion.  Skin:    General:  Skin is warm and dry.  Neurological:     General: No focal deficit present.     Mental Status: She is alert and oriented to person, place, and time. Mental status is at baseline.  Psychiatric:        Mood and Affect: Mood normal.        Behavior: Behavior normal.        Thought Content: Thought content normal.        Judgment: Judgment normal.     Results for orders placed or performed in visit on 05/09/23  POCT urinalysis dipstick  Result Value Ref Range   Color, UA     Clarity, UA NEG    Glucose, UA Negative Negative   Bilirubin, UA NEG    Ketones, UA NEG    Spec Grav, UA 1.025 1.010 - 1.025   Blood, UA 3+    pH, UA 6.0 5.0 - 8.0   Protein, UA Negative Negative   Urobilinogen, UA 0.2 0.2 or 1.0 E.U./dL   Nitrite, UA NEG    Leukocytes, UA Negative Negative   Appearance  Odor         Assessment & Plan:  Pain with urination -     POCT urinalysis dipstick -     Urine Culture  Hematuria, unspecified type  Strains to urinate -     Ambulatory referral to Urogynecology  Urgency of urination -     Ambulatory referral to Urogynecology  -Urinalysis only shows blood in urine. Will send urine for culture. Office will call with lab results and she may see them on MyChart. Will treat according to results.  -Placed a referral to urogynecology due straining to urinate and urgency to urinate at patients request.  -Request records from [facility].    Zandra Abts, NP

## 2023-05-09 NOTE — Patient Instructions (Addendum)
-  Urinalysis only shows blood in urine. Sent for culture. Office will call with lab results and you may see them on MyChart. Will treat according to results.  -Placed a referral to urogynecology to strains to urinate and urgency to urinate.  If you do not hear back about an appointment in 2 weeks or receive a MyChart message. Please call back to the office.

## 2023-05-11 ENCOUNTER — Telehealth: Payer: Self-pay

## 2023-05-11 ENCOUNTER — Other Ambulatory Visit: Payer: Self-pay

## 2023-05-11 DIAGNOSIS — N3000 Acute cystitis without hematuria: Secondary | ICD-10-CM

## 2023-05-11 LAB — URINE CULTURE
MICRO NUMBER:: 15206464
SPECIMEN QUALITY:: ADEQUATE

## 2023-05-11 MED ORDER — AMOXICILLIN-POT CLAVULANATE 500-125 MG PO TABS
1.0000 | ORAL_TABLET | Freq: Two times a day (BID) | ORAL | 0 refills | Status: DC
Start: 2023-05-11 — End: 2023-05-17

## 2023-05-11 NOTE — Telephone Encounter (Signed)
-----   Message from Zandra Abts sent at 05/11/2023  7:59 AM EDT ----- Your urine culture came positive for streptococcus agalactiae. Recommend to start Augmentin 500/125mg  tablet, 1 tablet every 12 hours for 5 days. (Dx: UTI) If not improved, follow up.

## 2023-05-17 ENCOUNTER — Telehealth: Payer: Self-pay | Admitting: Family Medicine

## 2023-05-17 ENCOUNTER — Encounter: Payer: Self-pay | Admitting: Family Medicine

## 2023-05-17 ENCOUNTER — Ambulatory Visit (INDEPENDENT_AMBULATORY_CARE_PROVIDER_SITE_OTHER): Payer: BC Managed Care – PPO | Admitting: Family Medicine

## 2023-05-17 VITALS — BP 122/70 | HR 80 | Temp 98.9°F | Ht 64.5 in | Wt 161.0 lb

## 2023-05-17 DIAGNOSIS — R21 Rash and other nonspecific skin eruption: Secondary | ICD-10-CM

## 2023-05-17 MED ORDER — PREDNISONE 10 MG PO TABS
ORAL_TABLET | ORAL | 0 refills | Status: AC
Start: 2023-05-17 — End: 2023-05-22

## 2023-05-17 NOTE — Progress Notes (Signed)
Acute Office Visit   Subjective:  Patient ID: Janifer Gieselman, female    DOB: [birth year], 52 y.o.   MRN: [id number]  Chief Complaint  Patient presents with   Rash    Pt reports rash on both legs, torso, face. OTC-benadryl,bleach washing with this rash and cortisol cream. SX started 2 weeks ago.    HPI: Patient is a 52 year old female that presents with rash on legs, torso, arms, and face. Itching and burning. She reports the was working in her yard about 2 weeks ago.   Patient has been using OTC Benadryl, washing rash with bleach and applying cortisol cream.   Symptoms started back around the same time she started Augmentin, but does not think this was the cause. She has completed taking Augmentin.   Review of Systems  Skin:  Positive for rash.  See HPI above     Objective:   BP 122/70   Pulse 80   Temp 98.9 F (37.2 C)   Ht 5' 4.5" (1.638 m)   Wt 161 lb (73 kg)   SpO2 98%   BMI 27.21 kg/m    Physical Exam Vitals reviewed.  Constitutional:      General: She is not in acute distress.    Appearance: Normal appearance. She is overweight. She is not ill-appearing, toxic-appearing or diaphoretic.  HENT:     Head: Normocephalic and atraumatic.  Eyes:     General:        Right eye: No discharge.        Left eye: No discharge.     Conjunctiva/sclera: Conjunctivae normal.  Cardiovascular:     Rate and Rhythm: Normal rate.  Pulmonary:     Effort: Pulmonary effort is normal. No respiratory distress.  Musculoskeletal:        General: Normal range of motion.  Skin:    General: Skin is warm and dry.     Findings: Rash (Pictures included of rash on legs, abd, and arms.) present.  Neurological:     General: No focal deficit present.     Mental Status: She is alert and oriented to person, place, and time. Mental status is at baseline.  Psychiatric:        Mood and Affect: Mood normal.        Behavior: Behavior normal.        Thought Content: Thought content normal.         Judgment: Judgment normal.            Assessment & Plan:  Rash and nonspecific skin eruption -     predniSONE; Take 6 tablets (60 mg total) by mouth daily with breakfast for 1 day, THEN 5 tablets (50 mg total) daily with breakfast for 1 day, THEN 4 tablets (40 mg total) daily with breakfast for 1 day, THEN 3 tablets (30 mg total) daily with breakfast for 1 day, THEN 2 tablets (20 mg total) daily with breakfast for 1 day, THEN 1 tablet (10 mg total) daily with breakfast for 1 day.  Dispense: 21 tablet; Refill: 0  -Prescribed Prednisone 10mg  tablets, 6 day taper for rash.  -Recommend to keep area cleansed with antibacterial soap, pat dry, and keep covered with any open areas.  -Advised she may continue to take over the counter Benadryl at night time if needed. -Recommended to take cool or least warm showers she can tolerate to help decrease itching and burning sensation.  -Follow up if not improved.   Zandra Abts, NP

## 2023-05-17 NOTE — Patient Instructions (Addendum)
-  Prescribed Prednisone 10mg  tablets, 6 day taper for rash.  -Recommend to keep area cleansed with antibacterial soap, pat dry, and keep covered with any open areas.  -You may continue to take over the counter Benadryl at night time if needed. -Recommended to take cool or least warm showers you can tolerate to help decrease itching and burning sensation.  -Follow up if not improved.

## 2023-05-17 NOTE — Telephone Encounter (Signed)
[facility] - Missing Information   Charge Sheet attached and placed in front bin

## 2023-05-17 NOTE — Telephone Encounter (Signed)
Placed in Dr.Tabori folder

## 2023-05-19 NOTE — Telephone Encounter (Signed)
No additional codes to provide at this time

## 2023-05-25 DIAGNOSIS — H938X3 Other specified disorders of ear, bilateral: Secondary | ICD-10-CM | POA: Diagnosis not present

## 2023-05-25 DIAGNOSIS — H9011 Conductive hearing loss, unilateral, right ear, with unrestricted hearing on the contralateral side: Secondary | ICD-10-CM | POA: Diagnosis not present

## 2023-05-25 DIAGNOSIS — Z9889 Other specified postprocedural states: Secondary | ICD-10-CM | POA: Diagnosis not present

## 2023-05-30 ENCOUNTER — Other Ambulatory Visit: Payer: Self-pay | Admitting: Family Medicine

## 2023-05-30 ENCOUNTER — Ambulatory Visit: Payer: BC Managed Care – PPO | Admitting: Family Medicine

## 2023-05-30 ENCOUNTER — Encounter: Payer: Self-pay | Admitting: Family Medicine

## 2023-05-30 VITALS — BP 116/68 | HR 88 | Temp 97.9°F | Resp 17 | Ht 64.5 in | Wt 157.5 lb

## 2023-05-30 DIAGNOSIS — E663 Overweight: Secondary | ICD-10-CM

## 2023-05-30 DIAGNOSIS — R1011 Right upper quadrant pain: Secondary | ICD-10-CM | POA: Diagnosis not present

## 2023-05-30 DIAGNOSIS — E559 Vitamin D deficiency, unspecified: Secondary | ICD-10-CM

## 2023-05-30 DIAGNOSIS — Z0000 Encounter for general adult medical examination without abnormal findings: Secondary | ICD-10-CM

## 2023-05-30 DIAGNOSIS — F902 Attention-deficit hyperactivity disorder, combined type: Secondary | ICD-10-CM

## 2023-05-30 DIAGNOSIS — J4540 Moderate persistent asthma, uncomplicated: Secondary | ICD-10-CM

## 2023-05-30 DIAGNOSIS — R21 Rash and other nonspecific skin eruption: Secondary | ICD-10-CM | POA: Diagnosis not present

## 2023-05-30 MED ORDER — PREDNISONE 10 MG PO TABS: ORAL_TABLET | ORAL | 0 refills | Status: DC

## 2023-05-30 MED ORDER — BUDESONIDE-FORMOTEROL FUMARATE 160-4.5 MCG/ACT IN AERO: 2.0000 | INHALATION_SPRAY | Freq: Two times a day (BID) | RESPIRATORY_TRACT | 3 refills | Status: DC

## 2023-05-30 NOTE — Progress Notes (Signed)
Subjective:    Patient ID: Sheryl Nichols, female    DOB: May 05, 1971, 52 y.o.   MRN: [id number]  HPI CPE- UTD on mammo, pap, colonoscopy.  Due for Tdap  Patient Care Team    Relationship Specialty Notifications Start End  Sheliah Hatch, MD PCP - General Family Medicine  12/20/13   Olivia Mackie, MD Consulting Physician Obstetrics and Gynecology  06/08/18      Health Maintenance  Topic Date Due   MAMMOGRAM  10/30/2021   INFLUENZA VACCINE  05/25/2023   HIV Screening  05/29/2024 (Originally 10/21/1986)   DTaP/Tdap/Td (3 - Td or Tdap) 06/05/2023   PAP SMEAR-Modifier  10/31/2023   Colonoscopy  12/21/2030   HPV VACCINES  Aged Out   COVID-19 Vaccine  Discontinued   Hepatitis C Screening  Discontinued   Zoster Vaccines- Shingrix  Discontinued      Review of Systems Patient reports no vision/ hearing changes, adenopathy,fever, weight change,  persistant/recurrent hoarseness , swallowing issues, chest pain, palpitations, edema, persistant/recurrent cough, hemoptysis, gastrointestinal bleeding (melena, rectal bleeding), significant heartburn, bowel changes, GU symptoms (dysuria, hematuria, incontinence), Gyn symptoms (abnormal  bleeding, pain),  syncope, focal weakness, memory loss, numbness & tingling, hair/nail changes, abnormal bruising or bleeding, anxiety, or depression.   + rash- bilateral lower legs.  Sxs improved w/ prednisone but returned 2 days after she completed meds.  + SOB- pt is not having good results w/ Arnuity Ellipta.  Finds herself using rescue inhaler multiple times daily.  + persistent RUQ pain- pt was initially having severe constipation after surgery and thought the pain was related.  Started Miralax and constipation improved but pain remained.  Labs WNL.    Objective:   Physical Exam General Appearance:    Alert, cooperative, no distress, appears stated age  Head:    Normocephalic, without obvious abnormality, atraumatic  Eyes:    PERRL,  conjunctiva/corneas clear, EOM's intact both eyes  Ears:    Normal TM's and external ear canals, both ears  Nose:   Nares normal, septum midline, mucosa normal, no drainage    or sinus tenderness  Throat:   Lips, mucosa, and tongue normal; teeth and gums normal  Neck:   Supple, symmetrical, trachea midline, no adenopathy;    Thyroid: no enlargement/tenderness/nodules  Back:     Symmetric, no curvature, ROM normal, no CVA tenderness  Lungs:     Clear to auscultation bilaterally, respirations unlabored  Chest Wall:    No tenderness or deformity   Heart:    Regular rate and rhythm, S1 and S2 normal, no murmur, rub   or gallop  Breast Exam:    Deferred to GYN  Abdomen:     Soft, mild TTP RUQ, bowel sounds active all four quadrants,    no masses, no organomegaly  Genitalia:    Deferred to GYN  Rectal:    Extremities:   Extremities normal, atraumatic, no cyanosis or edema  Pulses:   2+ and symmetric all extremities  Skin:   Skin color, texture, turgor normal, rash consistent w/ contact dermatitis  Lymph nodes:   Cervical, supraclavicular, and axillary nodes normal  Neurologic:   CNII-XII intact, normal strength, sensation and reflexes    throughout          Assessment & Plan:  RUQ pain- ongoing.  Pt no longer feels it's related to constipation b/c there was no change in her pain when she was on the Miralax.  Since labs were all unremarkable, next step is imaging to  see if a source of pain can be identified.  Pt expressed understanding and is in agreement w/ plan.   Rash- new to provider, ongoing for pt.  She was treated w/ 6 day course of prednisone and now appears to have rebound.  Will treat w/ longer course of Prednisone as rebound is common when treated for <9-10 days.  Pt expressed understanding and is in agreement w/ plan.

## 2023-05-30 NOTE — Telephone Encounter (Signed)
Patient is requesting a refill of the following medications: Requested Prescriptions   Pending Prescriptions Disp Refills   amphetamine-dextroamphetamine (ADDERALL XR) 25 MG 24 hr capsule 30 capsule 0    Sig: Take 1 capsule by mouth every morning.    Date of patient request: 05/30/23 Last office visit: 05/30/23 Date of last refill: 04/28/23 Last refill amount: 30 Follow up time period per chart: 1 year

## 2023-05-30 NOTE — Telephone Encounter (Signed)
Encourage patient to contact the pharmacy for refills or they can request refills through [facility]  (Please schedule appointment if patient has not been seen in over a year)    WHAT PHARMACY WOULD THEY LIKE THIS SENT TO: [facility] [phone] - [address]  MEDICATION NAME & DOSE: amphetamine-dextroamphetamine (ADDERALL XR) 25 MG 24 hr capsule   NOTES/COMMENTS FROM PATIENT: Pt forgot to mention needing a refill during her visit on 05/30/23     Front office please notify patient: It takes 48-72 hours to process rx refill requests Ask patient to call pharmacy to ensure rx is ready before heading there.

## 2023-05-30 NOTE — Patient Instructions (Signed)
Follow up in 1 year for your physical- but sooner if needed We'll notify you of your lab results and make any changes if needed START the Prednisone as directed- 3 pills at the same time x3 days, then 2 pills at the same time x3 days, then 1 pill daily.  Take w/ food  Take Claritin or Zyrtec daily to help w/ the itching and you can add Benadryl as needed SWITCH to Symbicort to help w/ your shortness of breath.  Message me when you are nearing the end of the inhaler and we can switch back to Qvar We'll call you to schedule your CT scan Continue to work on healthy diet and regular exercise- you look great! Call with any questions or concerns Hang in there!

## 2023-05-31 ENCOUNTER — Telehealth: Payer: Self-pay

## 2023-05-31 MED ORDER — AMPHETAMINE-DEXTROAMPHET ER 25 MG PO CP24
25.0000 mg | ORAL_CAPSULE | ORAL | 0 refills | Status: DC
Start: 2023-05-31 — End: 2023-07-04

## 2023-05-31 NOTE — Telephone Encounter (Signed)
Pt is aware of lab results and we have scheduled a 6 month follow up

## 2023-05-31 NOTE — Telephone Encounter (Signed)
-----   Message from Neena Rhymes sent at 05/31/2023  7:30 AM EDT ----- Total cholesterol and LDL (bad cholesterol) have both increased.  This will improve w/ healthy diet and regular exercise.  We will need an appt in 6 months to recheck cholesterol levels.  Remainder of labs are stable and look good

## 2023-06-06 ENCOUNTER — Telehealth: Payer: Self-pay | Admitting: Family Medicine

## 2023-06-06 NOTE — Telephone Encounter (Signed)
Sheryl Nichols from [facility] is needing a phone call from Prior Authorization because her insurance requires a prior auth.  Her number is [phone] ext- [phone]

## 2023-06-06 NOTE — Telephone Encounter (Signed)
Patient is waiting for the 15th and will re run then potentially will need a PA on the mediation otherwise she is well

## 2023-06-06 NOTE — Telephone Encounter (Signed)
We can only assist with medication authorizations. Please advise if a medication needs prior authorization.

## 2023-06-07 NOTE — Assessment & Plan Note (Signed)
Pt's PE WNL w/ exception of rash on legs and abd TTP.  UTD on pap, mammo, colonoscopy.  Declined Tdap at this time.  Check labs.  Anticipatory guidance provided.

## 2023-06-07 NOTE — Assessment & Plan Note (Signed)
Deteriorated.  Pt reports she is not well controlled on Arnuity Ellipta.  She is using her rescue inhaler multiple times daily.  Will switch to Symbicort for both immediate and long acting relief.  After she finishes the Symbicort will transition to Qvar.  Pt expressed understanding and is in agreement w/ plan.

## 2023-06-07 NOTE — Assessment & Plan Note (Signed)
Check labs and replete prn. 

## 2023-06-08 ENCOUNTER — Ambulatory Visit (HOSPITAL_BASED_OUTPATIENT_CLINIC_OR_DEPARTMENT_OTHER): Payer: Medicaid Other

## 2023-06-12 NOTE — Addendum Note (Signed)
Addended by: Sheliah Hatch on: 06/12/2023 10:17 AM   Modules accepted: Orders

## 2023-06-13 ENCOUNTER — Telehealth (HOSPITAL_BASED_OUTPATIENT_CLINIC_OR_DEPARTMENT_OTHER): Payer: Self-pay

## 2023-06-14 ENCOUNTER — Ambulatory Visit (HOSPITAL_BASED_OUTPATIENT_CLINIC_OR_DEPARTMENT_OTHER): Payer: Medicaid Other

## 2023-07-04 ENCOUNTER — Other Ambulatory Visit: Payer: Self-pay | Admitting: Family Medicine

## 2023-07-04 DIAGNOSIS — F902 Attention-deficit hyperactivity disorder, combined type: Secondary | ICD-10-CM

## 2023-07-04 MED ORDER — AMPHETAMINE-DEXTROAMPHET ER 25 MG PO CP24
25.0000 mg | ORAL_CAPSULE | ORAL | 0 refills | Status: DC
Start: 2023-07-04 — End: 2023-08-09

## 2023-07-04 NOTE — Telephone Encounter (Signed)
Encourage patient to contact the pharmacy for refills or they can request refills through [facility]   WHAT PHARMACY WOULD THEY LIKE THIS SENT TO:   [id number] - [place], [place] - [address], [place] [id number]   MEDICATION NAME & DOSE:  amphetamine-dextroamphetamine (ADDERALL XR) 25 MG 24 hr capsule    NOTES/COMMENTS FROM PATIENT:      Front office please notify patient: It takes 48-72 hours to process rx refill requests Ask patient to call pharmacy to ensure rx is ready before heading there.

## 2023-07-04 NOTE — Telephone Encounter (Signed)
Patient is requesting a refill of the following medications: Requested Prescriptions   Pending Prescriptions Disp Refills   amphetamine-dextroamphetamine (ADDERALL XR) 25 MG 24 hr capsule 30 capsule 0    Sig: Take 1 capsule by mouth every morning.    Date of patient request: 07/04/23 Last office visit: 05/30/2023 Date of last refill: 05/31/2023 Last refill amount: 30

## 2023-07-06 ENCOUNTER — Ambulatory Visit (INDEPENDENT_AMBULATORY_CARE_PROVIDER_SITE_OTHER): Payer: BC Managed Care – PPO | Admitting: Family Medicine

## 2023-07-06 ENCOUNTER — Encounter: Payer: Self-pay | Admitting: Family Medicine

## 2023-07-06 VITALS — BP 116/74 | HR 89 | Temp 98.2°F | Resp 18 | Wt 160.2 lb

## 2023-07-06 DIAGNOSIS — L237 Allergic contact dermatitis due to plants, except food: Secondary | ICD-10-CM | POA: Diagnosis not present

## 2023-07-06 MED ORDER — PREDNISONE 10 MG PO TABS: ORAL_TABLET | ORAL | 1 refills | Status: DC

## 2023-07-06 MED ORDER — METHYLPREDNISOLONE ACETATE 80 MG/ML IJ SUSP
80.0000 mg | Freq: Once | INTRAMUSCULAR | Status: AC
Start: 2023-07-06 — End: 2023-07-06
  Administered 2023-07-06: 80 mg via INTRAMUSCULAR

## 2023-07-06 NOTE — Patient Instructions (Signed)
Follow up as needed or as scheduled START the Prednisone tomorrow as directed- 3 pills at the same time x3 days, then 2 pills at the same time x3 days, then 1 pill daily.  Take w/ food  Use Claritin or Zyrtec daily to help w/ itching and add Benadryl as needed Cool compresses to help w/ itching and swelling Call with any questions or concerns Hang in there!

## 2023-07-06 NOTE — Progress Notes (Signed)
   Subjective:    Patient ID: Sheryl Nichols, female    DOB: Sep 28, 1971, 52 y.o.   MRN: [id number]  HPI Rash- pt reports she was cutting down vines in her daughter's yard on Sunday.  Rash started Monday.  L eye is swollen, has rash on L side of face.  Has bumps on hands, arms, under breasts, neck.  Very itchy.  Swollen.  This has happened to pt before when working outside.   Review of Systems For ROS see HPI     Objective:   Physical Exam Vitals reviewed.  Constitutional:      General: She is not in acute distress.    Appearance: Normal appearance. She is not ill-appearing.  HENT:     Head: Normocephalic and atraumatic.  Eyes:     Comments: Swelling of L upper and lower lids  Skin:    General: Skin is warm and dry.     Findings: Rash (contact dermatitis of hands, arms, chest, face) present.  Neurological:     Mental Status: She is alert.           Assessment & Plan:  Contact dermatitis- new.  Pt having robust reaction to the plants she came in contact w/ on Sunday.  Depomedrol injxn given in office to jump start anti-inflammatory, anti-itch properties.  Start Prednisone taper tomorrow.  Reviewed supportive care and red flags that should prompt return.  Pt expressed understanding and is in agreement w/ plan.

## 2023-07-10 ENCOUNTER — Telehealth (HOSPITAL_BASED_OUTPATIENT_CLINIC_OR_DEPARTMENT_OTHER): Payer: Self-pay

## 2023-07-14 ENCOUNTER — Encounter: Payer: Self-pay | Admitting: Obstetrics and Gynecology

## 2023-07-14 ENCOUNTER — Ambulatory Visit (INDEPENDENT_AMBULATORY_CARE_PROVIDER_SITE_OTHER): Payer: BC Managed Care – PPO | Admitting: Obstetrics and Gynecology

## 2023-07-14 ENCOUNTER — Other Ambulatory Visit (HOSPITAL_COMMUNITY)
Admission: RE | Admit: 2023-07-14 | Discharge: 2023-07-14 | Disposition: A | Discharge status: Home / Self Care | Payer: BC Managed Care – PPO | Source: Other Acute Inpatient Hospital | Attending: Obstetrics and Gynecology | Admitting: Obstetrics and Gynecology

## 2023-07-14 VITALS — BP 155/85 | HR 65 | Ht 63.78 in | Wt 165.0 lb

## 2023-07-14 DIAGNOSIS — R319 Hematuria, unspecified: Secondary | ICD-10-CM | POA: Diagnosis not present

## 2023-07-14 DIAGNOSIS — N816 Rectocele: Secondary | ICD-10-CM | POA: Diagnosis not present

## 2023-07-14 DIAGNOSIS — R350 Frequency of micturition: Secondary | ICD-10-CM | POA: Diagnosis not present

## 2023-07-14 DIAGNOSIS — N8110 Cystocele, unspecified: Secondary | ICD-10-CM | POA: Diagnosis not present

## 2023-07-14 DIAGNOSIS — N3281 Overactive bladder: Secondary | ICD-10-CM

## 2023-07-14 DIAGNOSIS — R3916 Straining to void: Secondary | ICD-10-CM | POA: Diagnosis not present

## 2023-07-14 DIAGNOSIS — R82998 Other abnormal findings in urine: Secondary | ICD-10-CM

## 2023-07-14 LAB — POCT URINALYSIS DIPSTICK
Bilirubin, UA: NEGATIVE
Glucose, UA: NEGATIVE
Ketones, UA: NEGATIVE
Nitrite, UA: NEGATIVE
Protein, UA: NEGATIVE
Spec Grav, UA: 1.015 (ref 1.010–1.025)
Urobilinogen, UA: 0.2 E.U./dL
pH, UA: 5.5 (ref 5.0–8.0)

## 2023-07-14 LAB — URINALYSIS, ROUTINE W REFLEX MICROSCOPIC
Bilirubin Urine: NEGATIVE
Glucose, UA: NEGATIVE mg/dL
Ketones, ur: NEGATIVE mg/dL
Leukocytes,Ua: NEGATIVE
Nitrite: NEGATIVE
Protein, ur: NEGATIVE mg/dL
Specific Gravity, Urine: 1.025 (ref 1.005–1.030)
pH: 6 (ref 5.0–8.0)
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
## 2023-07-14 NOTE — Patient Instructions (Addendum)
Today we talked about ways to manage bladder urgency such as altering your diet to avoid irritative beverages and foods (bladder diet) as well as attempting to decrease stress and other exacerbating factors.    The Most Bothersome Foods* The Least Bothersome Foods*  Coffee - Regular & Decaf Tea - caffeinated Carbonated beverages - cola, non-colas, diet & caffeine-free Alcohols - Beer, Red Wine, White Wine, [address] - Grapefruit, [place], Orange, [employer] - Cranberry, Grapefruit, Orange, Pineapple Vegetables - Tomato & Tomato Products Flavor Enhancers - Hot peppers, Spicy foods, Chili, Horseradish, Vinegar, Monosodium glutamate (MSG) Artificial Sweeteners - NutraSweet, Sweet 'N Low, Equal (sweetener), Saccharin Ethnic foods - [place], [place], [place] food [employer] - low-fat & whole Fruits - Bananas, Blueberries, Honeydew melon, Pears, Raisins, Watermelon Vegetables - Broccoli, [address] Sprouts, [place], Carrots, Cauliflower, [place], Cucumber, Mushrooms, Peas, Radishes, Squash, Zucchini, White potatoes, Sweet potatoes & yams Poultry - Chicken, Eggs, [place], [employer] - Beef, Diplomatic Services operational officer, Lamb Seafood - Shrimp, [place] fish, Salmon Grains - Oat, Rice Snacks - Pretzels, Popcorn  *Lenward Chancellor et al. Diet and its role in interstitial cystitis/bladder pain syndrome (IC/BPS) and comorbid conditions. [employer]. BJU Int. 2012 Jan 11.    URODYNAMICS (UDS) TEST INFORMATION  IMPORTANT: Please try to arrive with a comfortably full bladder!  **Complete a 3 day bladder diary prior to your appointment.  What is UDS? Marland Kitchen Urodynamics is a bladder test used to evaluate how your bladder and urethra (tube you urinate out of) work to help find out the cause of your bladder symptoms and evaluate your bladder function in order to make the best treatment plan for you.   What to expect? . A nurse will perform the test and will be with you during the entire exam. . First we will have to empty your  bladder on a special toilet.  After you have emptied your bladder, very small catheters (plastic tubing) will be placed into your bladder and into your vagina (or rectum). These special small catheters measure pressure to help measure your bladder function.  Your bladder will be gently filled with water and you will be asked to cough and strain at several different points during the test.   . You will then be asked to empty your bladder in the special toilet with the catheters in place. Most patients can urinate (pee) easily with the catheters in place since the catheters are so small. . In total this procedure lasts about 45 minutes to 1 hour.  . After your test is completed, you will return (or possibly be seen the same day) to review the results, talk about treatment options and make a plan moving forward.

## 2023-07-15 LAB — URINE CULTURE: Culture: 50000 — AB

## 2023-07-17 MED ORDER — AMOXICILLIN 500 MG PO CAPS
500.0000 mg | ORAL_CAPSULE | Freq: Two times a day (BID) | ORAL | 0 refills | Status: AC
Start: 2023-07-17 — End: 2023-07-22

## 2023-07-17 NOTE — Addendum Note (Signed)
Addended by: Marguerita Beards on: 07/17/2023 11:58 AM   Modules accepted: Orders

## 2023-07-17 NOTE — Progress Notes (Signed)
Pt was notifed.

## 2023-07-26 ENCOUNTER — Ambulatory Visit (INDEPENDENT_AMBULATORY_CARE_PROVIDER_SITE_OTHER): Payer: BC Managed Care – PPO | Admitting: Obstetrics and Gynecology

## 2023-07-26 ENCOUNTER — Encounter: Payer: Self-pay | Admitting: Obstetrics and Gynecology

## 2023-07-26 VITALS — BP 128/81 | HR 73

## 2023-07-26 DIAGNOSIS — R350 Frequency of micturition: Secondary | ICD-10-CM

## 2023-07-26 DIAGNOSIS — N3281 Overactive bladder: Secondary | ICD-10-CM

## 2023-07-26 LAB — POCT URINALYSIS DIPSTICK
Bilirubin, UA: NEGATIVE
Glucose, UA: NEGATIVE
Ketones, UA: NEGATIVE
Nitrite, UA: NEGATIVE
Protein, UA: NEGATIVE
Spec Grav, UA: 1.015 (ref 1.010–1.025)
Urobilinogen, UA: 0.2 U/dL
pH, UA: 6 (ref 5.0–8.0)

## 2023-07-26 NOTE — Progress Notes (Signed)
[facility] Urodynamics Procedure  Referring Physician: Sheliah Hatch, MD Date of Procedure: 07/26/2023  Sheryl Nichols is a 52 y.o. female who presents for urodynamic evaluation. Indication(s) for study: occult SUI and OAB  Vital Signs: BP 128/81   Pulse 73   Laboratory Results: A catheterized urine specimen revealed:  POC urine:   Lab Results  Component Value Date   COLORU Yellow 07/26/2023   CLARITYU Clear 07/26/2023   GLUCOSEUR Negative 07/26/2023   BILIRUBINUR Negative 07/26/2023   KETONESU Negative 07/26/2023   SPECGRAV 1.015 07/26/2023   RBCUR Trace 07/26/2023   PHUR 6.0 07/26/2023   PROTEINUR Negative 07/26/2023   UROBILINOGEN 0.2 07/26/2023   LEUKOCYTESUR Trace (A) 07/26/2023      Voiding Diary: The patient voided 12 times per day. Voided volumes ranged from 150 mL to 475 mL, with an average of 218 mL.  Intervals between voids ranged from 1 hr to 5hr with an average interval between voids of 1.5 hr.  Intake per day ranged from to .  Output ranged from to .  She had 0 incontinence episodes per day (0 UUI, 0 SUI) and 2 episodes of nocturia.  She drinks: 12 oz of caffeinated beverages, 0 oz of juice, and 12 oz of alcohol per day.  Procedure Timeout:  The correct patient was verified and the correct procedure was verified. The patient was in the correct position and safety precautions were reviewed based on at the patient's history.  Urodynamic Procedure A 50F dual lumen urodynamics catheter was placed under sterile conditions into the patient's bladder. A 50F catheter was placed into the rectum in order to measure abdominal pressure. EMG patches were placed in the appropriate position.  All connections were confirmed and calibrations/adjusted made. Saline was instilled into the bladder through the dual lumen catheters.  Cough/valsalva pressures were measured periodically during filling.  Patient was allowed to void.  The  bladder was then emptied of its residual.  UROFLOW: Revealed a Qmax of 7.1 mL/sec.  She voided 41 mL and had a residual of 50 mL.  It was a normal pattern and represented normal habits though interpretation limited due to low voided volume.  CMG: This was performed with sterile water in the sitting position at a fill rate of 20-30 mL/min.    First sensation of fullness was 39 mLs,  First urge was 68 mLs,  Strong urge was 103 mLs and  Capacity was 430 mLs  Stress incontinence was not demonstrated Highest negative Barrier CLPP was 152 cmH20 at 203 ml. Highest negative Barrier VLPP was 178 cmH20 at 203 ml.  Detrusor function was overactive, with phasic contractions seen.  The first occurred at 145.2 mL to 5 cm of water and was associated with urge.  Compliance:  Normal . End fill detrusor pressure was 2.5 cmH20.  Calculated compliance was 147mL/cmH20  UPP: MUCP with barrier reduction was 155 cm of water.    MICTURITION STUDY: Voiding was performed with reduction using scopettes in the sitting position.  Pdet at Qmax was 65 cm of water.  Qmax was 8 mL/sec.  It was a low and prolonged pattern.  She voided 255 mL and had a residual of 175 mL.  It was a volitional void, sustained detrusor contraction was present and abdominal straining was present  EMG: This was performed with patches.  She had voluntary contractions, recruitment with fill was present and urethral sphincter was not relaxed with void.  The details of the procedure with  the study tracings have been scanned into EPIC.   Urodynamic Impression:  1. Sensation was increased; capacity was normal 2. Stress Incontinence was not demonstrated at normal pressures; 3. 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 4. Emptying was dysfunctional with a mildly elevated PVR (175) , a sustained detrusor contraction present,  abdominal straining present, dyssynergic urethral sphincter activity on EMG.  Plan: - The patient will  follow up  to discuss the findings and treatment options.

## 2023-07-28 ENCOUNTER — Telehealth: Payer: Self-pay | Admitting: Obstetrics and Gynecology

## 2023-07-28 DIAGNOSIS — R300 Dysuria: Secondary | ICD-10-CM

## 2023-07-28 MED ORDER — SULFAMETHOXAZOLE-TRIMETHOPRIM 800-160 MG PO TABS
1.0000 | ORAL_TABLET | Freq: Two times a day (BID) | ORAL | 0 refills | Status: AC
Start: 2023-07-28 — End: 2023-07-31

## 2023-07-28 NOTE — Telephone Encounter (Signed)
Pt called and stated she is having some urgency and irritation after urodynamics. Will prescribe bactrim DS x3 BID x 3 days for presumed UTI.   Marguerita Beards, MD

## 2023-07-31 NOTE — Telephone Encounter (Signed)
Patient picked up medication and states she feeling much better.

## 2023-08-04 ENCOUNTER — Ambulatory Visit: Payer: BC Managed Care – PPO | Admitting: Obstetrics and Gynecology

## 2023-08-07 ENCOUNTER — Other Ambulatory Visit: Payer: Self-pay | Admitting: Family Medicine

## 2023-08-07 DIAGNOSIS — J310 Chronic rhinitis: Secondary | ICD-10-CM

## 2023-08-09 ENCOUNTER — Other Ambulatory Visit: Payer: Self-pay | Admitting: Family Medicine

## 2023-08-09 DIAGNOSIS — F902 Attention-deficit hyperactivity disorder, combined type: Secondary | ICD-10-CM

## 2023-08-09 MED ORDER — AMPHETAMINE-DEXTROAMPHET ER 25 MG PO CP24
25.0000 mg | ORAL_CAPSULE | ORAL | 0 refills | Status: DC
Start: 2023-08-09 — End: 2023-10-11

## 2023-08-09 NOTE — Telephone Encounter (Signed)
Patient is requesting a refill of the following medications: Requested Prescriptions   Pending Prescriptions Disp Refills   amphetamine-dextroamphetamine (ADDERALL XR) 25 MG 24 hr capsule 30 capsule 0    Sig: Take 1 capsule by mouth every morning.    Date of patient request: 08/09/23 Last office visit: 07/06/23 Date of last refill: 06/03/23 Last refill amount: 30 Follow up time period per chart: PRN

## 2023-08-09 NOTE — Telephone Encounter (Signed)
Encourage patient to contact the pharmacy for refills or they can request refills through [facility]  (Please schedule appointment if patient has not been seen in over a year)    WHAT PHARMACY WOULD THEY LIKE THIS SENT TO: [id number] - [place], [place] - [address]   MEDICATION NAME & DOSE: amphetamine-dextroamphetamine (ADDERALL XR) 25 MG 24 hr capsule  fluticasone (FLONASE) 50 MCG/ACT nasal spray   NOTES/COMMENTS FROM PATIENT:      Front office please notify patient: It takes 48-72 hours to process rx refill requests Ask patient to call pharmacy to ensure rx is ready before heading there.

## 2023-08-10 NOTE — Telephone Encounter (Signed)
Patient contacted and thanked [place] for quick response

## 2023-08-23 ENCOUNTER — Encounter: Payer: Self-pay | Admitting: Obstetrics and Gynecology

## 2023-08-23 ENCOUNTER — Ambulatory Visit (INDEPENDENT_AMBULATORY_CARE_PROVIDER_SITE_OTHER): Payer: BC Managed Care – PPO | Admitting: Obstetrics and Gynecology

## 2023-08-23 VITALS — BP 126/85 | HR 77

## 2023-08-23 DIAGNOSIS — R3916 Straining to void: Secondary | ICD-10-CM | POA: Diagnosis not present

## 2023-08-23 DIAGNOSIS — N8110 Cystocele, unspecified: Secondary | ICD-10-CM

## 2023-08-23 DIAGNOSIS — N3281 Overactive bladder: Secondary | ICD-10-CM

## 2023-08-23 NOTE — Progress Notes (Signed)
[facility] Return Visit  SUBJECTIVE  History of Present Illness: Kilee Lichtenstein is a 52 y.o. female seen in follow-up for incomplete bladder emptying and urinary frequency.   Urodynamic Impression:  1. Sensation was increased; capacity was normal 2. Stress Incontinence was not demonstrated at normal pressures; 3. 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 4. Emptying was dysfunctional with a mildly elevated PVR (175) , a sustained detrusor contraction present,  abdominal straining present, dyssynergic urethral sphincter activity on EMG.   Voiding Diary: The patient voided 12 times per day. Voided volumes ranged from 150 mL to 475 mL, with an average of 218 mL.  Intervals between voids ranged from 1 hr to 5hr with an average interval between voids of 1.5 hr.  Intake per day ranged from to .  Output ranged from to .  She had 0 incontinence episodes per day (0 UUI, 0 SUI) and 2 episodes of nocturia.  She drinks: 12 oz of caffeinated beverages, 0 oz of juice, and 12 oz of alcohol per day.    Past Medical History: Patient  has a past medical history of ADHD (attention deficit hyperactivity disorder), combined type (04/22/2021), Anemia (07/31/2007), Asthma (08/22/2009), Carpal tunnel syndrome (06/04/2013), Chronic rhinitis (09/13/2009), Dermatographic urticaria (09/06/2010), Heart murmur (07/31/2007), History of COVID-19 (12/02/2020), History of Hashimoto thyroiditis, History of tympanoplasty (02/28/2023), Hyperhidrosis (02/21/2014), Lumbar radiculopathy (02/19/2021), Migraines, and Recurrent urinary tract infection (08/22/2019).   Past Surgical History: She  has a past surgical history that includes Septoplasty; Breast enhancement surgery; Tympanoplasty (Bilateral); Tonsillectomy and adenoidectomy; Colonoscopy (2009); Wisdom tooth extraction; Appendectomy; and Breast reduction surgery.   Medications: She has a current medication list which  includes the following prescription(s): albuterol, amphetamine-dextroamphetamine, budesonide-formoterol, cetirizine, fluticasone, prednisone, valacyclovir, and vitamin d (ergocalciferol).   Allergies: Patient is allergic to codeine and latex.   Social History: Patient  reports that she has never smoked. She has never used smokeless tobacco. She reports current alcohol use of about 14.0 - 21.0 standard drinks of alcohol per week. She reports that she does not use drugs.      OBJECTIVE     Physical Exam: Vitals:   08/23/23 0820  BP: 126/85  Pulse: 77   Gen: No apparent distress, A&O x 3.  Detailed Urogynecologic Evaluation:  Pt agreed to pessary fitting. Tried 2-5 ring with supports. The #4 ring with support was the most comfortable, fit well, and stayed in placed with strong cough, valsalva and bending. Lot # [id number]     ASSESSMENT AND PLAN    Ms. Maekawa is a 52 y.o. with:  1. Prolapse of anterior vaginal wall   2. Overactive bladder   3. Straining to void     - Reviewed urodynamics- has some pelvic floor dyssynergia but only at the start of the void. Continues to strain throughout voiding. Suspect this is due to prolapse.  - Trial of pessary today- placed #4 ring with support. Will have her try this for 1-2 weeks and see if this improves her symptoms.  Marguerita Beards, MD   Time spent: I spent 20 minutes dedicated to the care of this patient on the date of this encounter to include pre-visit review of records, face-to-face time with the patient and post visit documentation. Additional time was spent on the pessary fitting.

## 2023-08-23 NOTE — Patient Instructions (Signed)
You were fitted with a #4 ring with support pessary.  Come back in 2-3 weeks to see how it is working.  You can take it out every week, or sooner if you desire. Clean with liquid soap and water in between uses and leave out over night on the night that you remove it. Call for any problems.

## 2023-09-08 ENCOUNTER — Encounter: Payer: Self-pay | Admitting: Obstetrics and Gynecology

## 2023-09-08 ENCOUNTER — Ambulatory Visit (INDEPENDENT_AMBULATORY_CARE_PROVIDER_SITE_OTHER): Payer: BC Managed Care – PPO | Admitting: Obstetrics and Gynecology

## 2023-09-08 VITALS — BP 116/73 | HR 77

## 2023-09-08 DIAGNOSIS — N812 Incomplete uterovaginal prolapse: Secondary | ICD-10-CM | POA: Diagnosis not present

## 2023-09-08 DIAGNOSIS — R339 Retention of urine, unspecified: Secondary | ICD-10-CM | POA: Diagnosis not present

## 2023-09-08 NOTE — Assessment & Plan Note (Signed)
-   Pessary has improved incomplete emptying

## 2023-09-08 NOTE — Assessment & Plan Note (Addendum)
-   Continue with #4 cup pessary. Also given her #4 ring to keep in case she wants to try again.  - Remove pessary weekly and clean with soap and water - Will have her return 3 months or sooner if needed for pessary check. If doing well at that time, can extend interval between visits since she is self-maintaining.  - Briefly discussed that surgery is also an option but she does not want to pursue at this time due to needing time off from work.

## 2023-09-08 NOTE — Progress Notes (Signed)
[facility] Return Visit  SUBJECTIVE  History of Present Illness: Sheryl Nichols is a 52 y.o. female seen in follow-up for incomplete bladder emptying and urinary frequency.   She is emptying her bladder a lot better with the pessary. She feels her urine stream is stronger. She does feel like the pessary is not sitting as well since she had her period, kind of turned sideways.   Past Medical History: Patient  has a past medical history of ADHD (attention deficit hyperactivity disorder), combined type (04/22/2021), Anemia (07/31/2007), Asthma (08/22/2009), Carpal tunnel syndrome (06/04/2013), Chronic rhinitis (09/13/2009), Dermatographic urticaria (09/06/2010), Heart murmur (07/31/2007), History of COVID-19 (12/02/2020), History of Hashimoto thyroiditis, History of tympanoplasty (02/28/2023), Hyperhidrosis (02/21/2014), Lumbar radiculopathy (02/19/2021), Migraines, and Recurrent urinary tract infection (08/22/2019).   Past Surgical History: She  has a past surgical history that includes Septoplasty; Breast enhancement surgery; Tympanoplasty (Bilateral); Tonsillectomy and adenoidectomy; Colonoscopy (2009); Wisdom tooth extraction; Appendectomy; and Breast reduction surgery.   Medications: She has a current medication list which includes the following prescription(s): albuterol, amphetamine-dextroamphetamine, budesonide-formoterol, cetirizine, fluticasone, prednisone, valacyclovir, and vitamin d (ergocalciferol).   Allergies: Patient is allergic to codeine and latex.   Social History: Patient  reports that she has never smoked. She has never used smokeless tobacco. She reports current alcohol use of about 14.0 - 21.0 standard drinks of alcohol per week. She reports that she does not use drugs.      OBJECTIVE     Physical Exam: Vitals:   09/08/23 0828  BP: 116/73  Pulse: 77    Gen: No apparent distress, A&O x 3.  Detailed Urogynecologic Evaluation:  Pessary noted  to be in place, but cervix was causing it to tilt sideways. The pessary was removed and cleaned. It was replaced with a #4 cup pessary which fit well and was comfortable. Lot#[id number].     ASSESSMENT AND PLAN    Sheryl Nichols is a 52 y.o. with:  1. Uterovaginal prolapse, incomplete   2. Incomplete bladder emptying     Uterovaginal prolapse, incomplete Assessment & Plan:  - Continue with #4 cup pessary. Also given her #4 ring to keep in case she wants to try again.  - Remove pessary weekly and clean with soap and water - Will have her return 3 months or sooner if needed for pessary check. If doing well at that time, can extend interval between visits since she is self-maintaining.  - Briefly discussed that surgery is also an option but she does not want to pursue at this time due to needing time off from work.    Incomplete bladder emptying Assessment & Plan: - Pessary has improved incomplete emptying     Marguerita Beards, MD   Time spent: I spent 20 minutes dedicated to the care of this patient on the date of this encounter to include pre-visit review of records, face-to-face time with the patient and post visit documentation. Additional time was spent on the pessary fitting.

## 2023-09-25 ENCOUNTER — Other Ambulatory Visit: Payer: Self-pay | Admitting: Family Medicine

## 2023-10-11 ENCOUNTER — Other Ambulatory Visit: Payer: Self-pay

## 2023-10-11 DIAGNOSIS — F902 Attention-deficit hyperactivity disorder, combined type: Secondary | ICD-10-CM

## 2023-10-11 MED ORDER — AMPHETAMINE-DEXTROAMPHET ER 25 MG PO CP24
25.0000 mg | ORAL_CAPSULE | ORAL | 0 refills | Status: DC
Start: 2023-10-11 — End: 2023-12-01

## 2023-10-11 NOTE — Telephone Encounter (Signed)
Copied from CRM [id number]. Topic: Clinical - Medication Refill >> Oct 11, 2023 11:01 AM Dennison Nancy wrote: Most Recent Primary Care Visit:  Provider: Sheliah Hatch  Department: [facility]  Visit Type: OFFICE VISIT  Date: 07/06/2023  Medication: amphetamine-dextroamphetamine (ADDERALL XR) 25 MG 24 hr capsule,fluticasone (FLONASE) 50 MCG/ACT nasal spray  Has the patient contacted their pharmacy? Yes , patient stated she always have to call her provider for the Adderall  (Agent: If no, request that the patient contact the pharmacy for the refill. If patient does not wish to contact the pharmacy document the reason why and proceed with request.) (Agent: If yes, when and what did the pharmacy advise?)  Is this the correct pharmacy for this prescription? Yes  If no, delete pharmacy and type the correct one.  This is the patient's preferred pharmacy:  [id number] [place], [place] - [address] AT [address] [place] [address] Phone: [phone] Fax: [phone]   Has the prescription been filled recently? No   Is the patient out of the medication? Yes   Has the patient been seen for an appointment in the last year OR does the patient have an upcoming appointment? Yes   Can we respond through MyChart? No    Agent: Please be advised that Rx refills may take up to 3 business days. We ask that you follow-up with your pharmacy.

## 2023-10-11 NOTE — Telephone Encounter (Signed)
Requested Prescriptions   Pending Prescriptions Disp Refills   amphetamine-dextroamphetamine (ADDERALL XR) 25 MG 24 hr capsule 30 capsule 0    Sig: Take 1 capsule by mouth every morning.     Date of patient request: 10/11/2023 Last office visit: 07/06/2023 Upcoming visit: 12/01/2023 Date of last refill: 08/09/2023 Last refill amount: 30

## 2023-10-23 NOTE — Telephone Encounter (Signed)
error 

## 2023-12-01 ENCOUNTER — Ambulatory Visit: Payer: Medicaid Other | Admitting: Family Medicine

## 2023-12-01 ENCOUNTER — Encounter: Payer: Self-pay | Admitting: Family Medicine

## 2023-12-01 VITALS — BP 126/72 | HR 72 | Wt 172.0 lb

## 2023-12-01 DIAGNOSIS — E663 Overweight: Secondary | ICD-10-CM | POA: Diagnosis not present

## 2023-12-01 DIAGNOSIS — M7711 Lateral epicondylitis, right elbow: Secondary | ICD-10-CM | POA: Diagnosis not present

## 2023-12-01 DIAGNOSIS — J310 Chronic rhinitis: Secondary | ICD-10-CM

## 2023-12-01 DIAGNOSIS — F902 Attention-deficit hyperactivity disorder, combined type: Secondary | ICD-10-CM | POA: Diagnosis not present

## 2023-12-01 MED ORDER — FLUTICASONE PROPIONATE 50 MCG/ACT NA SUSP: NASAL | 3 refills | Status: DC

## 2023-12-01 MED ORDER — PREDNISONE 10 MG PO TABS: ORAL_TABLET | ORAL | 0 refills | Status: DC

## 2023-12-01 MED ORDER — AMPHETAMINE-DEXTROAMPHET ER 25 MG PO CP24
25.0000 mg | ORAL_CAPSULE | ORAL | 0 refills | Status: DC
Start: 2023-12-01 — End: 2024-01-08

## 2023-12-01 NOTE — Progress Notes (Signed)
   Subjective:    Patient ID: Sheryl Nichols, female    DOB: January 14, 1971, 53 y.o.   MRN: [id number]  HPI R elbow pain- pt reports she hits her elbow quite frequently.  Has shooting pain from elbow up into upper arm and down to wrist.  Has pain w/ full ROM- difficult to stretch.  ADHD- currently on Adderall XR 25mg  daily.  Pt doesn't want to increase dose at this time.  Will take medication breaks when not in school.  Overweight- + 15 lb weight gain.  BMI 29.73  no regular exercise.  Not following a particular diet.   Review of Systems For ROS see HPI     Objective:   Physical Exam Constitutional:      General: She is not in acute distress.    Appearance: Normal appearance. She is well-developed. She is not ill-appearing.  HENT:     Head: Normocephalic and atraumatic.  Eyes:     Conjunctiva/sclera: Conjunctivae normal.     Pupils: Pupils are equal, round, and reactive to light.  Neck:     Thyroid : No thyromegaly.  Cardiovascular:     Rate and Rhythm: Normal rate and regular rhythm.     Heart sounds: Normal heart sounds. No murmur heard. Pulmonary:     Effort: Pulmonary effort is normal. No respiratory distress.     Breath sounds: Normal breath sounds.  Abdominal:     General: There is no distension.     Palpations: Abdomen is soft.     Tenderness: There is no abdominal tenderness.  Musculoskeletal:        General: Tenderness (TTP over R lateral epicondyle, pain w/ grip and supination) present.     Cervical back: Normal range of motion and neck supple.  Lymphadenopathy:     Cervical: No cervical adenopathy.  Skin:    General: Skin is warm and dry.  Neurological:     Mental Status: She is alert and oriented to person, place, and time.  Psychiatric:        Behavior: Behavior normal.           Assessment & Plan:  R lateral epicondylitis- new.  Pt's sxs and PE consistent w/ dx.  Start prednisone  taper.  Encouraged ice.  Counter-force strap.  Pt expressed  understanding and is in agreement w/ plan.

## 2023-12-01 NOTE — Patient Instructions (Signed)
 Schedule your complete physical in 6 months Keep up the good work on healthy diet and regular exercise- you can do it! START the Prednisone  as directed- 3 pills at the same time x3 days, then 2 pills at the same time x3 days, then 1 pill daily.  Take w/ food  Call with any questions or concerns Stay Safe!  Stay Healthy! Hang in there!!!

## 2023-12-08 ENCOUNTER — Ambulatory Visit: Payer: BC Managed Care – PPO | Admitting: Obstetrics and Gynecology

## 2023-12-22 NOTE — Assessment & Plan Note (Signed)
 Ongoing issue.  Reports she is doing well on Adderall XR 25mg  daily.  She is not interested in increasing her dose.  Takes medication breaks when not in school.  Denies palpitations, anxiety, insomnia.  Refill provided.

## 2023-12-22 NOTE — Assessment & Plan Note (Signed)
+   15 lb weight gain since last visit.  BMI now 29.73.  She admits no regular exercise and she is not following a particular diet.  Stressed need for healthy food choices and regular activity.  Will follow.

## 2024-01-08 ENCOUNTER — Other Ambulatory Visit: Payer: Self-pay | Admitting: Family Medicine

## 2024-01-08 DIAGNOSIS — F902 Attention-deficit hyperactivity disorder, combined type: Secondary | ICD-10-CM

## 2024-01-08 MED ORDER — AMPHETAMINE-DEXTROAMPHET ER 25 MG PO CP24: 25.0000 mg | ORAL_CAPSULE | ORAL | 0 refills | Status: DC

## 2024-01-08 NOTE — Telephone Encounter (Signed)
 Copied from CRM [id number]. Topic: Clinical - Medication Refill >> Jan 08, 2024  2:04 PM Aletta Edouard wrote: Most Recent Primary Care Visit:  Provider: Sheliah Hatch  Department: [facility]  Visit Type: OFFICE VISIT  Date: 12/01/2023  Medication: amphetamine-dextroamphetamine (ADDERALL XR) 25 MG 24  Has the patient contacted their pharmacy? No (Agent: If no, request that the patient contact the pharmacy for the refill. If patient does not wish to contact the pharmacy document the reason why and proceed with request.) (Agent: If yes, when and what did the pharmacy advise?)  Is this the correct pharmacy for this prescription? Yes If no, delete pharmacy and type the correct one.  This is the patient's preferred pharmacy:  [id number] [place], [place] - [address] [facility] [address] [place] [address] Phone: [phone] Fax: [phone]   Has the prescription been filled recently? No  Is the patient out of the medication? Yes  Has the patient been seen for an appointment in the last year OR does the patient have an upcoming appointment? Yes  Can we respond through MyChart? No  Agent: Please be advised that Rx refills may take up to 3 business days. We ask that you follow-up with your pharmacy.

## 2024-01-08 NOTE — Telephone Encounter (Signed)
 Requested Prescriptions   Pending Prescriptions Disp Refills   amphetamine-dextroamphetamine (ADDERALL XR) 25 MG 24 hr capsule 30 capsule 0    Sig: Take 1 capsule by mouth every morning.     Date of patient request: 01/08/2024 Last office visit: 12/01/2023 Upcoming visit: 05/31/2024 Date of last refill: 12/01/2023 Last refill amount: 30

## 2024-01-15 ENCOUNTER — Telehealth: Payer: Self-pay

## 2024-01-15 NOTE — Telephone Encounter (Signed)
 Copied from CRM [id number]. Topic: General - Other >> Jan 15, 2024 10:40 AM Eunice Blase wrote: Reason for CRM: Pt called stated needs orders for labs done to check thyroid. Please call pt at [phone]

## 2024-01-19 ENCOUNTER — Encounter: Payer: Self-pay | Admitting: Student in an Organized Health Care Education/Training Program

## 2024-01-19 ENCOUNTER — Ambulatory Visit (INDEPENDENT_AMBULATORY_CARE_PROVIDER_SITE_OTHER): Admitting: Student in an Organized Health Care Education/Training Program

## 2024-01-19 ENCOUNTER — Other Ambulatory Visit

## 2024-01-19 VITALS — BP 102/64 | HR 78 | Temp 98.2°F | Wt 176.0 lb

## 2024-01-19 DIAGNOSIS — L659 Nonscarring hair loss, unspecified: Secondary | ICD-10-CM

## 2024-01-19 DIAGNOSIS — E039 Hypothyroidism, unspecified: Secondary | ICD-10-CM

## 2024-01-19 DIAGNOSIS — E663 Overweight: Secondary | ICD-10-CM

## 2024-01-19 LAB — LIPID PANEL
Cholesterol: 253 mg/dL — ABNORMAL HIGH (ref 0–200)
HDL: 97.3 mg/dL (ref >39.00)
LDL Cholesterol: 137 mg/dL — ABNORMAL HIGH (ref 0–99)
NonHDL: 155.55
Total CHOL/HDL Ratio: 3
Triglycerides: 92 mg/dL (ref 0.0–149.0)
VLDL: 18.4 mg/dL (ref 0.0–40.0)

## 2024-01-19 LAB — T4, FREE: Free T4: 0.79 ng/dL (ref 0.60–1.60)

## 2024-01-19 LAB — TSH: TSH: 3.13 u[IU]/mL (ref 0.35–5.50)

## 2024-01-19 LAB — HEMOGLOBIN A1C: Hgb A1c MFr Bld: 5.6 % (ref 4.6–6.5)

## 2024-01-19 NOTE — Progress Notes (Signed)
   Acute Office Visit  Subjective:     Patient ID: Sheryl Nichols, female    DOB: 1971-08-01, 53 y.o.   MRN: [id number]  Chief Complaint  Patient presents with   Obesity    Pt is here today to discuss weight gain, hair loss, hunger after eating. She has had Tyroid issues in past and would like her levels checked today Pt reports FASTING    HPI  Patient is in today for concerns of hypothyroidism.  Patient reports a history of Hashimoto's thyroiditis, was hyperthyroid about 20 years ago.  This was treated supportively.  Did not have ablation.  Then she had a period of hypothyroidism and was treated with Synthroid for about 5 years.  She discontinued the Synthroid many years ago because her thyroid function returned to normal without it.  She recently went back to school and has been studying speech pathology.  She has been noticing increasing fatigue, brain fog, changes in her nails being more brittle, more dry skin.  She has noticed weight gain, about 30 to 40 pounds.  Also noticed having hair loss, started using minoxidil over-the-counter.  No recent illness, no fevers or chills.  She is having a regular menstrual periods for a while now that she associates with early menopause.  No pica.       Objective:    BP 102/64   Pulse 78   Temp 98.2 F (36.8 C)   Wt 176 lb (79.8 kg)   SpO2 98%   BMI 30.42 kg/m    Physical Exam  General: Well-appearing woman Ears: Bilateral not impacted cerumen Neck: Mildly enlarged thyroid, no focal nodules, no adenopathy Heart: Regular, no murmurs Extremities: 1+ pitting edema both extremities      Assessment & Plan:   Problem List Items Addressed This Visit       Unprioritized   Hypothyroidism   Patient with both a history of hyper and hypothyroidism in the past.  Has been off Synthroid for many years.  Now having symptoms consistent with hypothyroidism.  Exam has a moderate sized goiter.  No nodules.  Will check TSH and free T4 today,  and may restart Synthroid replacement.      Relevant Orders   TSH   T4, free   Overweight (BMI 25.0-29.9)   Patient reports a 40 pound weight gain over the last few months.  Currently 176 pounds with a BMI of 30.  She was 151 pounds in our office this time last year.  This was associated with hyperlipidemia.  Will check A1c today and lipids, may have worsened with her recent weight gain.      Relevant Orders   Hemoglobin A1c   Lipid panel   Hair loss - Primary   Acute issue over the last several weeks.  May be part of the symptoms she is experiencing from possible hypothyroidism.  Muscle going to check iron levels today because she is still menstruating to rule out iron deficiency.      Relevant Orders   TSH   Iron, TIBC and Ferritin Panel    Tyson Alias, MD

## 2024-01-19 NOTE — Assessment & Plan Note (Signed)
 Patient with both a history of hyper and hypothyroidism in the past.  Has been off Synthroid for many years.  Now having symptoms consistent with hypothyroidism.  Exam has a moderate sized goiter.  No nodules.  Will check TSH and free T4 today, and may restart Synthroid replacement.

## 2024-01-19 NOTE — Assessment & Plan Note (Addendum)
 Patient reports a 40 pound weight gain over the last few months.  Currently 176 pounds with a BMI of 30.  She was 151 pounds in our office this time last year.  This was associated with hyperlipidemia.  Will check A1c today and lipids, may have worsened with her recent weight gain.

## 2024-01-19 NOTE — Assessment & Plan Note (Signed)
 Acute issue over the last several weeks.  May be part of the symptoms she is experiencing from possible hypothyroidism.  Muscle going to check iron levels today because she is still menstruating to rule out iron deficiency.

## 2024-01-20 LAB — IRON,TIBC AND FERRITIN PANEL
%SAT: 30 % (ref 16–45)
Ferritin: 6 ng/mL — ABNORMAL LOW (ref 16–232)
Iron: 123 ug/dL (ref 45–160)
TIBC: 416 ug/dL (ref 250–450)

## 2024-01-22 ENCOUNTER — Telehealth: Payer: Self-pay | Admitting: Student in an Organized Health Care Education/Training Program

## 2024-01-22 DIAGNOSIS — E611 Iron deficiency: Secondary | ICD-10-CM

## 2024-01-22 MED ORDER — POLYSACCHARIDE IRON COMPLEX 150 MG PO CAPS: 150.0000 mg | ORAL_CAPSULE | Freq: Every day | ORAL | 1 refills | Status: DC

## 2024-01-22 NOTE — Telephone Encounter (Signed)
 I spoke with the patient by phone about lab results.  Thyroid function normal.  Did show iron deficiency.  May be contributing to her hearing loss and other sensations of fatigue.  Likely etiology is from menorrhagia, periods have become irregular, likely perimenopausal.  I prescribed iron polysaccharide 150 mg to be taken once daily with food.  Talked about potential side effects.  I will put in future orders for iron recheck in about 4 weeks.  Goal would be to get the ferritin around 50 to stop her symptoms.  If we cannot accomplish that with oral iron replacement, I will arrange for IV iron.

## 2024-01-29 ENCOUNTER — Other Ambulatory Visit: Payer: Self-pay | Admitting: Family Medicine

## 2024-02-12 DIAGNOSIS — H9311 Tinnitus, right ear: Secondary | ICD-10-CM | POA: Diagnosis not present

## 2024-02-12 DIAGNOSIS — H9011 Conductive hearing loss, unilateral, right ear, with unrestricted hearing on the contralateral side: Secondary | ICD-10-CM | POA: Diagnosis not present

## 2024-02-12 DIAGNOSIS — Z9889 Other specified postprocedural states: Secondary | ICD-10-CM | POA: Diagnosis not present

## 2024-02-16 ENCOUNTER — Other Ambulatory Visit

## 2024-02-16 DIAGNOSIS — E611 Iron deficiency: Secondary | ICD-10-CM

## 2024-02-17 LAB — IRON,TIBC AND FERRITIN PANEL
%SAT: 24 % (ref 16–45)
Ferritin: 7 ng/mL — ABNORMAL LOW (ref 16–232)
Iron: 99 ug/dL (ref 45–160)
TIBC: 410 ug/dL (ref 250–450)

## 2024-02-19 ENCOUNTER — Telehealth: Payer: Self-pay

## 2024-02-19 ENCOUNTER — Telehealth: Payer: Self-pay | Admitting: Student in an Organized Health Care Education/Training Program

## 2024-02-19 DIAGNOSIS — D509 Iron deficiency anemia, unspecified: Secondary | ICD-10-CM | POA: Insufficient documentation

## 2024-02-19 DIAGNOSIS — D508 Other iron deficiency anemias: Secondary | ICD-10-CM

## 2024-02-19 NOTE — Telephone Encounter (Signed)
 I spoke with the patient by phone to follow-up on iron  deficiency.  She has been using oral iron  polysaccharide once daily for the last 4 weeks.  Having some upset stomach, but reports still able to take this medicine every day.  Unfortunately no improvement in ferritin levels.  Still looks iron  deficiency.  Also still having symptoms of fatigue, hair loss, and brain fog.  Seems like she is not absorbing the medicine very well.  We talked about options for doing iron  infusion, I think this has a good chance of improving at least some of her symptoms.  She agrees and we will arrange this.  By the Sanjuanita Cruz she has about a 1000 mg iron  deficit, so will arrange for 2 doses of Feraheme.

## 2024-02-19 NOTE — Telephone Encounter (Signed)
 Dr. Gayl Katos, patient will be scheduled as soon as possible.  Auth Submission: NO AUTH NEEDED Site of care: Site of care: [facility] Payer: [employer] commercial Medication & CPT/J Code(s) submitted: Feraheme (ferumoxytol) [id number] Route of submission (phone, fax, portal): phone Phone # [phone] Fax # Auth type: Buy/Bill PB Units/visits requested: 510mg  x 2 doses Reference number: [id number] Approval from: 02/19/24 to 07/21/24

## 2024-02-26 ENCOUNTER — Ambulatory Visit

## 2024-02-26 VITALS — BP 143/78 | HR 78 | Temp 98.0°F | Resp 18 | Ht 64.5 in | Wt 170.4 lb

## 2024-02-26 DIAGNOSIS — D508 Other iron deficiency anemias: Secondary | ICD-10-CM

## 2024-02-26 DIAGNOSIS — D509 Iron deficiency anemia, unspecified: Secondary | ICD-10-CM

## 2024-02-26 MED ORDER — SODIUM CHLORIDE 0.9 % IV SOLN
510.0000 mg | Freq: Once | INTRAVENOUS | Status: AC
  Administered 2024-02-26: 510 mg via INTRAVENOUS
  Filled 2024-02-26: qty 17

## 2024-02-26 MED ORDER — DIPHENHYDRAMINE HCL 25 MG PO CAPS
25.0000 mg | ORAL_CAPSULE | Freq: Once | ORAL | Status: AC
  Administered 2024-02-26: 25 mg via ORAL
  Filled 2024-02-26: qty 1

## 2024-02-26 MED ORDER — ACETAMINOPHEN 325 MG PO TABS
650.0000 mg | ORAL_TABLET | Freq: Once | ORAL | Status: AC
  Administered 2024-02-26: 650 mg via ORAL
  Filled 2024-02-26: qty 2

## 2024-02-26 NOTE — Progress Notes (Signed)
 Diagnosis: Iron  Deficiency Anemia  Provider:  Praveen Mannam MD  Procedure: IV Infusion  IV Type: Peripheral, IV Location: L Antecubital  Feraheme (Ferumoxytol), Dose: 510 mg  Infusion Start Time: 1336  Infusion Stop Time: 1354  Post Infusion IV Care: Observation period completed and Peripheral IV Discontinued  Discharge: Condition: Good, Destination: Home . AVS Provided  Performed by:  Lauran Pollard, LPN

## 2024-03-04 ENCOUNTER — Ambulatory Visit (INDEPENDENT_AMBULATORY_CARE_PROVIDER_SITE_OTHER)

## 2024-03-04 VITALS — BP 129/78 | HR 79 | Temp 98.1°F | Resp 18 | Ht 64.5 in | Wt 170.8 lb

## 2024-03-04 DIAGNOSIS — D509 Iron deficiency anemia, unspecified: Secondary | ICD-10-CM | POA: Diagnosis not present

## 2024-03-04 DIAGNOSIS — D508 Other iron deficiency anemias: Secondary | ICD-10-CM

## 2024-03-04 MED ORDER — ACETAMINOPHEN 325 MG PO TABS
650.0000 mg | ORAL_TABLET | Freq: Once | ORAL | Status: AC
  Administered 2024-03-04: 650 mg via ORAL
  Filled 2024-03-04: qty 2

## 2024-03-04 MED ORDER — DIPHENHYDRAMINE HCL 25 MG PO CAPS
25.0000 mg | ORAL_CAPSULE | Freq: Once | ORAL | Status: AC
  Administered 2024-03-04: 25 mg via ORAL
  Filled 2024-03-04: qty 1

## 2024-03-04 MED ORDER — FERUMOXYTOL INJECTION 510 MG/17 ML
510.0000 mg | Freq: Once | INTRAVENOUS | Status: AC
  Administered 2024-03-04: 510 mg via INTRAVENOUS
  Filled 2024-03-04: qty 17

## 2024-03-04 NOTE — Progress Notes (Signed)
 Diagnosis: Iron  Deficiency Anemia  Provider:  Praveen Mannam MD  Procedure: IV Infusion  IV Type: Peripheral, IV Location: L Antecubital  Feraheme (Ferumoxytol ), Dose: 510 mg  Infusion Start Time: 1358  Infusion Stop Time: 1420  Post Infusion IV Care: Observation period completed  Discharge: Condition: Good, Destination: Home . AVS Provided  Performed by:  Natividad Balding, RN

## 2024-03-07 ENCOUNTER — Encounter: Payer: Self-pay | Admitting: Family Medicine

## 2024-03-07 ENCOUNTER — Telehealth: Payer: Self-pay | Admitting: Family Medicine

## 2024-03-07 DIAGNOSIS — Z111 Encounter for screening for respiratory tuberculosis: Secondary | ICD-10-CM

## 2024-03-07 DIAGNOSIS — E611 Iron deficiency: Secondary | ICD-10-CM

## 2024-03-07 NOTE — Telephone Encounter (Unsigned)
 Copied from CRM [id number]. Topic: Clinical - Request for Lab/Test Order >> Mar 07, 2024  3:32 PM Chasity T wrote: Reason for CRM: Patient is needing a TB test done for school and would like to request orders for it to be done at the clinic. Asking if you could please call her to let her know when it can be done [phone]

## 2024-03-07 NOTE — Telephone Encounter (Signed)
 Patient requesting form be filled out based on last years physical and then would like form updated after this years physical is completed. [place] of [place]th. Patient has attached form below.

## 2024-03-12 NOTE — Addendum Note (Signed)
 Addended by: Larissa Plowman on: 03/12/2024 09:06 AM   Modules accepted: Orders

## 2024-03-12 NOTE — Telephone Encounter (Signed)
 Called pt and she will call back to make lab appt only

## 2024-03-13 ENCOUNTER — Encounter (INDEPENDENT_AMBULATORY_CARE_PROVIDER_SITE_OTHER): Admitting: Family Medicine

## 2024-03-13 ENCOUNTER — Other Ambulatory Visit

## 2024-03-13 DIAGNOSIS — Z111 Encounter for screening for respiratory tuberculosis: Secondary | ICD-10-CM

## 2024-03-13 NOTE — Addendum Note (Signed)
 Addended by: Indigo Chaddock E on: 03/13/2024 04:10 PM   Modules accepted: Orders

## 2024-03-13 NOTE — Telephone Encounter (Signed)
 Patient would like to pick this up Tuesday when she is in for skin test

## 2024-03-13 NOTE — Telephone Encounter (Signed)
 Patient came in for her TB test and physical form that Dr. Paulla Bossier was going to fill out. Confirmed with Dr. Paulla Bossier that the form was not ready yet, we just received the form on 5/15 via my chart. Patient stated that she specifically needed the skin test and not the quantiferon. She stated she could not come back Friday to have the skin test read. She rescheduled the TB skin for next Tuesday 5/27 and would like to get the forms then. Patient questioned whether she needed her iron  level rechecked to be sure the the infusions were working. After reviewing patient's chart and labs, I did not see any documentation regarding a recheck for her iron  and did not see any result notes on her previous iron  from 4/25. Patient was informed that we would check on this and confirm if another iron  level was needed when she comes back on 5/27 and give her a call.

## 2024-03-14 NOTE — Telephone Encounter (Signed)
 I do see your phone note from 4/28, I missed that, apologies. I did not see any notes on the lab result itself. Patient has already had infusions done and that is what she was questioning if she needed her iron  level checked here. I do see that Dr. Paulla Bossier has ordered that iron  panel to be completed and a blood count. Looks like everything has been addressed for this patient.   Thanks!

## 2024-03-14 NOTE — Telephone Encounter (Signed)
 Sheryl Nichols, please see my phone note from 4/28. I spoke with the patient about the persistently low iron  levels. I ordered an iron  infusion at that time. Can we follow up to see if that order was received by the infusion center?

## 2024-03-19 ENCOUNTER — Ambulatory Visit (INDEPENDENT_AMBULATORY_CARE_PROVIDER_SITE_OTHER)

## 2024-03-19 DIAGNOSIS — Z111 Encounter for screening for respiratory tuberculosis: Secondary | ICD-10-CM

## 2024-03-19 LAB — TB SKIN TEST

## 2024-03-19 NOTE — Progress Notes (Signed)
 TB skin test for work  Placed today in the outer aspect of the Rt lower arm, wheel is visible and prominent, placement time is 1:15pm, provided directions to patient on care over next 48-72 hours

## 2024-03-20 ENCOUNTER — Ambulatory Visit (INDEPENDENT_AMBULATORY_CARE_PROVIDER_SITE_OTHER): Admitting: Family Medicine

## 2024-03-20 VITALS — BP 100/74 | HR 106 | Temp 98.2°F | Ht 64.5 in | Wt 168.8 lb

## 2024-03-20 DIAGNOSIS — J029 Acute pharyngitis, unspecified: Secondary | ICD-10-CM

## 2024-03-20 DIAGNOSIS — L237 Allergic contact dermatitis due to plants, except food: Secondary | ICD-10-CM

## 2024-03-20 LAB — POCT RAPID STREP A (OFFICE): Rapid Strep A Screen: NEGATIVE

## 2024-03-20 MED ORDER — PREDNISONE 10 MG PO TABS: ORAL_TABLET | ORAL | 0 refills | Status: DC

## 2024-03-20 NOTE — Progress Notes (Unsigned)
   Subjective:    Patient ID: Sheryl Nichols, female    DOB: 08/22/1971, 53 y.o.   MRN: [id number]  HPI Sore throat- pt reports sxs started Sunday night after working in the yard.  If very susceptible to plant dermatitis.  Has itchy rash on forearms bilaterally.  Pt reports tongue felt large and swollen on Monday.  Taking allergy medication daily- tongue is less swollen.  Able to swallow and talk.   Review of Systems For ROS see HPI     Objective:   Physical Exam Vitals reviewed.  Constitutional:      General: She is not in acute distress.    Appearance: Normal appearance. She is not ill-appearing.  HENT:     Head: Normocephalic and atraumatic.     Nose: No congestion.     Mouth/Throat:     Mouth: Mucous membranes are moist.     Pharynx: No oropharyngeal exudate or posterior oropharyngeal erythema.     Comments: Tongue normal size, no lesions present Eyes:     General:        Right eye: No discharge.     Extraocular Movements: Extraocular movements intact.  Cardiovascular:     Rate and Rhythm: Regular rhythm. Tachycardia present.  Pulmonary:     Effort: Pulmonary effort is normal. No respiratory distress.     Breath sounds: No wheezing or rhonchi.  Musculoskeletal:     Cervical back: Neck supple.  Lymphadenopathy:     Cervical: No cervical adenopathy.  Skin:    General: Skin is warm and dry.     Findings: Rash (vesicular rash consistent w/ poison ivy) present.  Neurological:     General: No focal deficit present.     Mental Status: She is alert and oriented to person, place, and time.     Cranial Nerves: No cranial nerve deficit.     Motor: No weakness.     Coordination: Coordination normal.     Gait: Gait normal.  Psychiatric:        Mood and Affect: Mood normal.        Behavior: Behavior normal.        Thought Content: Thought content normal.           Assessment & Plan:  Allergic contact dermatitis- new.  Pt has hx of similar that tends to spread  widely and rapidly.  Suspect her sore throat and swollen tongue were part of the allergic rxn.  Thankfully she takes daily antihistamines so systemic rxn was mild.  Will start Prednisone  taper.  Reviewed supportive care and red flags that should prompt return.  Pt expressed understanding and is in agreement w/ plan.

## 2024-03-20 NOTE — Patient Instructions (Signed)
 Follow up as needed or as scheduled START the Prednisone  as directed- 3 pills at the same time x3 days, then 2 pills at the same time x3 days, then 1 pill daily.  Take w/ food  Apply hydrocortisone cream twice daily to the itchy areas CONTINUE your daily allergy medication Call with any questions or concerns Stay Safe!  Stay Healthy! Hang in there!!!

## 2024-03-21 ENCOUNTER — Encounter: Payer: Self-pay | Admitting: Family Medicine

## 2024-03-21 ENCOUNTER — Ambulatory Visit (INDEPENDENT_AMBULATORY_CARE_PROVIDER_SITE_OTHER)

## 2024-03-21 ENCOUNTER — Telehealth: Payer: Self-pay

## 2024-03-21 DIAGNOSIS — E611 Iron deficiency: Secondary | ICD-10-CM

## 2024-03-21 NOTE — Telephone Encounter (Signed)
 Patient came in today for a nurse visit and was questioning when she needed to repeat Iron  labs since having her infusion 2 weeks ago. I told patient I would speak with the ordering provider and get back to her. Talked with Dr.Vincent and he stated she should return 2-4 weeks for repeat iron  lab after last infusion, provider gave the okay to order future labs and schedule patient. Called patient to schedule repeat lab. future orders placed by Dr.Tabori and I scheduled lab appointment.

## 2024-03-21 NOTE — Progress Notes (Signed)
 PPD Reading Note  PPD read and results entered in EpicCare.  Result: 0 mm induration.  Interpretation: negative  If test not read within 48-72 hours of initial placement, patient advised to repeat in other arm 1-3 weeks after this test.  Allergic reaction: no

## 2024-03-25 ENCOUNTER — Other Ambulatory Visit

## 2024-03-26 ENCOUNTER — Telehealth: Payer: Self-pay

## 2024-03-26 NOTE — Telephone Encounter (Signed)
 Copied from CRM [id number]. Topic: Clinical - Lab/Test Results >> Mar 26, 2024  1:35 PM Leah C wrote: Reason for CRM:  Patient called in to ask if paperwork showing her TB test results can be emailed to her [email or web address] or uploaded to her MyChart. She stated that she needs the paperwork to submit to her school as soon as possible.

## 2024-03-27 ENCOUNTER — Other Ambulatory Visit (INDEPENDENT_AMBULATORY_CARE_PROVIDER_SITE_OTHER)

## 2024-03-27 ENCOUNTER — Ambulatory Visit: Payer: Self-pay | Admitting: Family Medicine

## 2024-03-27 DIAGNOSIS — E611 Iron deficiency: Secondary | ICD-10-CM | POA: Diagnosis not present

## 2024-03-27 LAB — IBC + FERRITIN
Ferritin: 108.6 ng/mL (ref 10.0–291.0)
Iron: 102 ug/dL (ref 42–145)
Saturation Ratios: 33.3 % (ref 20.0–50.0)
TIBC: 306.6 ug/dL (ref 250.0–450.0)
Transferrin: 219 mg/dL (ref 212.0–360.0)

## 2024-03-27 LAB — CBC WITH DIFFERENTIAL/PLATELET
Basophils Absolute: 0 10*3/uL (ref 0.0–0.1)
Basophils Relative: 0.2 % (ref 0.0–3.0)
Eosinophils Absolute: 0.2 10*3/uL (ref 0.0–0.7)
Eosinophils Relative: 2.8 % (ref 0.0–5.0)
HCT: 40.4 % (ref 36.0–46.0)
Hemoglobin: 13.7 g/dL (ref 12.0–15.0)
Lymphocytes Relative: 43.3 % (ref 12.0–46.0)
Lymphs Abs: 3.8 10*3/uL (ref 0.7–4.0)
MCHC: 33.8 g/dL (ref 30.0–36.0)
MCV: 92.2 fl (ref 78.0–100.0)
Monocytes Absolute: 0.5 10*3/uL (ref 0.1–1.0)
Monocytes Relative: 6.1 % (ref 3.0–12.0)
Neutro Abs: 4.1 10*3/uL (ref 1.4–7.7)
Neutrophils Relative %: 47.6 % (ref 43.0–77.0)
Platelets: 319 10*3/uL (ref 150.0–400.0)
RBC: 4.38 Mil/uL (ref 3.87–5.11)
RDW: 14.1 % (ref 11.5–15.5)
WBC: 8.7 10*3/uL (ref 4.0–10.5)

## 2024-03-27 NOTE — Telephone Encounter (Signed)
 Patient came by clinic and kelly clark printed these forms out for this patient

## 2024-03-29 NOTE — Progress Notes (Signed)
 Pt has reviewed lab results via MyChart

## 2024-04-03 ENCOUNTER — Other Ambulatory Visit: Payer: Self-pay | Admitting: Family Medicine

## 2024-04-03 DIAGNOSIS — F902 Attention-deficit hyperactivity disorder, combined type: Secondary | ICD-10-CM

## 2024-04-03 MED ORDER — AMPHETAMINE-DEXTROAMPHET ER 25 MG PO CP24: 25.0000 mg | ORAL_CAPSULE | ORAL | 0 refills | Status: DC

## 2024-04-03 NOTE — Telephone Encounter (Signed)
 Copied from CRM [id number]. Topic: Clinical - Medication Refill >> Apr 03, 2024  1:34 PM [name] wrote: Medication: amphetamine -dextroamphetamine (ADDERALL XR) 25 MG 24 hr capsule  Has the patient contacted their pharmacy? Yes   This is the patient's preferred pharmacy:  [id number] - [place], [place] - [address] & [address] [place] [address] Phone: [phone] Fax: [phone]  Is this the correct pharmacy for this prescription? Yes If no, delete pharmacy and type the correct one.   Has the prescription been filled recently? No  Is the patient out of the medication? No  Has the patient been seen for an appointment in the last year OR does the patient have an upcoming appointment? Yes  Can we respond through MyChart? Yes  Agent: Please be advised that Rx refills may take up to 3 business days. We ask that you follow-up with your pharmacy.

## 2024-04-03 NOTE — Telephone Encounter (Signed)
 Requested Prescriptions   Pending Prescriptions Disp Refills   amphetamine -dextroamphetamine (ADDERALL XR) 25 MG 24 hr capsule 30 capsule 0    Sig: Take 1 capsule by mouth every morning.     Date of patient request: 04/03/2024 Last office visit: 03/20/2024 Upcoming visit: 06/10/2024 Date of last refill: 01/08/2024 Last refill amount: 30 caps 0 refills

## 2024-05-06 ENCOUNTER — Other Ambulatory Visit: Payer: Self-pay | Admitting: Family Medicine

## 2024-05-06 DIAGNOSIS — F902 Attention-deficit hyperactivity disorder, combined type: Secondary | ICD-10-CM

## 2024-05-06 NOTE — Telephone Encounter (Unsigned)
 Copied from CRM [id number]. Topic: Clinical - Medication Refill >> May 06, 2024  3:35 PM Mercedes MATSU wrote: Medication: amphetamine -dextroamphetamine (ADDERALL XR) 25 MG 24 hr capsule  Has the patient contacted their pharmacy? Yes (Agent: If no, request that the patient contact the pharmacy for the refill. If patient does not wish to contact the pharmacy document the reason why and proceed with request.) (Agent: If yes, when and what did the pharmacy advise?)  This is the patient's preferred pharmacy:  [id number] - [place], [place] - [address] AT [address] & [address] [place] [address] Phone: [phone] Fax: [phone]  Is this the correct pharmacy for this prescription? Yes If no, delete pharmacy and type the correct one.   Has the prescription been filled recently? Yes  Is the patient out of the medication? Yes  Has the patient been seen for an appointment in the last year OR does the patient have an upcoming appointment? Yes  Can we respond through MyChart? Yes  Agent: Please be advised that Rx refills may take up to 3 business days. We ask that you follow-up with your pharmacy.

## 2024-05-06 NOTE — Progress Notes (Signed)
 This encounter was created in error - please disregard.

## 2024-05-07 MED ORDER — AMPHETAMINE-DEXTROAMPHET ER 25 MG PO CP24
25.0000 mg | ORAL_CAPSULE | ORAL | 0 refills | Status: DC
Start: 2024-05-07 — End: 2024-08-22

## 2024-05-08 LAB — HM MAMMOGRAPHY

## 2024-05-31 ENCOUNTER — Encounter: Payer: BC Managed Care – PPO | Admitting: Family Medicine

## 2024-06-07 ENCOUNTER — Other Ambulatory Visit: Payer: Self-pay | Admitting: Family Medicine

## 2024-06-10 ENCOUNTER — Encounter: Payer: Self-pay | Admitting: Family Medicine

## 2024-06-10 ENCOUNTER — Ambulatory Visit: Admitting: Family Medicine

## 2024-06-10 VITALS — BP 148/78 | HR 78 | Temp 98.0°F | Wt 164.2 lb

## 2024-06-10 DIAGNOSIS — Z0000 Encounter for general adult medical examination without abnormal findings: Secondary | ICD-10-CM | POA: Diagnosis not present

## 2024-06-10 DIAGNOSIS — R051 Acute cough: Secondary | ICD-10-CM

## 2024-06-10 DIAGNOSIS — E559 Vitamin D deficiency, unspecified: Secondary | ICD-10-CM

## 2024-06-10 DIAGNOSIS — E663 Overweight: Secondary | ICD-10-CM

## 2024-06-10 DIAGNOSIS — U071 COVID-19: Secondary | ICD-10-CM

## 2024-06-10 DIAGNOSIS — Z114 Encounter for screening for human immunodeficiency virus [HIV]: Secondary | ICD-10-CM

## 2024-06-10 LAB — BASIC METABOLIC PANEL WITH GFR
BUN: 13 mg/dL (ref 6–23)
CO2: 26 meq/L (ref 19–32)
Calcium: 9 mg/dL (ref 8.4–10.5)
Chloride: 102 meq/L (ref 96–112)
Creatinine, Ser: 0.65 mg/dL (ref 0.40–1.20)
GFR: 101.08 mL/min (ref >60.00)
Glucose, Bld: 92 mg/dL (ref 70–99)
Potassium: 3.7 meq/L (ref 3.5–5.1)
Sodium: 138 meq/L (ref 135–145)

## 2024-06-10 LAB — CBC WITH DIFFERENTIAL/PLATELET
Basophils Absolute: 0 K/uL (ref 0.0–0.1)
Basophils Relative: 0.4 % (ref 0.0–3.0)
Eosinophils Absolute: 0 K/uL (ref 0.0–0.7)
Eosinophils Relative: 0.9 % (ref 0.0–5.0)
HCT: 38.2 % (ref 36.0–46.0)
Hemoglobin: 13.1 g/dL (ref 12.0–15.0)
Lymphocytes Relative: 36.4 % (ref 12.0–46.0)
Lymphs Abs: 1.8 K/uL (ref 0.7–4.0)
MCHC: 34.3 g/dL (ref 30.0–36.0)
MCV: 94 fl (ref 78.0–100.0)
Monocytes Absolute: 0.3 K/uL (ref 0.1–1.0)
Monocytes Relative: 6.5 % (ref 3.0–12.0)
Neutro Abs: 2.7 K/uL (ref 1.4–7.7)
Neutrophils Relative %: 55.8 % (ref 43.0–77.0)
Platelets: 284 K/uL (ref 150.0–400.0)
RBC: 4.07 Mil/uL (ref 3.87–5.11)
RDW: 13 % (ref 11.5–15.5)
WBC: 4.8 K/uL (ref 4.0–10.5)

## 2024-06-10 LAB — HEPATIC FUNCTION PANEL
ALT: 71 U/L — ABNORMAL HIGH (ref 0–35)
AST: 35 U/L (ref 0–37)
Albumin: 4.3 g/dL (ref 3.5–5.2)
Alkaline Phosphatase: 103 U/L (ref 39–117)
Bilirubin, Direct: 0.1 mg/dL (ref 0.0–0.3)
Total Bilirubin: 0.4 mg/dL (ref 0.2–1.2)
Total Protein: 7 g/dL (ref 6.0–8.3)

## 2024-06-10 LAB — LIPID PANEL
Cholesterol: 216 mg/dL — ABNORMAL HIGH (ref 0–200)
HDL: 73 mg/dL (ref >39.00)
LDL Cholesterol: 124 mg/dL — ABNORMAL HIGH (ref 0–99)
NonHDL: 142.77
Total CHOL/HDL Ratio: 3
Triglycerides: 94 mg/dL (ref 0.0–149.0)
VLDL: 18.8 mg/dL (ref 0.0–40.0)

## 2024-06-10 LAB — VITAMIN D 25 HYDROXY (VIT D DEFICIENCY, FRACTURES): VITD: 50.96 ng/mL (ref 30.00–100.00)

## 2024-06-10 LAB — TSH: TSH: 2.17 u[IU]/mL (ref 0.35–5.50)

## 2024-06-10 NOTE — Assessment & Plan Note (Signed)
 Pt's PE WNL.  UTD on pap, mammo, colonoscopy.  Will get Tdap at pharmacy since she has Medicare.  Check labs.  Anticipatory guidance provided.

## 2024-06-10 NOTE — Patient Instructions (Signed)
 Follow up in 1 year or as needed We'll notify you of your lab results and make any changes if needed Make sure you are drinking LOTS of fluids and getting plenty of rest! Keep up the good work on healthy diet and regular exercise- you look great! Call with any questions or concerns Hang in there!!!

## 2024-06-10 NOTE — Progress Notes (Unsigned)
   Subjective:    Patient ID: Sheryl Nichols, female    DOB: [birth year], 53 y.o.   MRN: [id number]  HPI CPE- UTD on mammo, pap, colonoscopy.  Due for Tdap.  Patient Care Team    Relationship Specialty Notifications Start End  Mahlon Comer BRAVO, MD PCP - General Family Medicine  03/07/24   Gorge Ade, MD Consulting Physician Obstetrics and Gynecology  06/08/18     Health Maintenance  Topic Date Due   HIV Screening  Never done   Hepatitis B Vaccines 19-59 Average Risk (1 of 3 - 19+ 3-dose series) Never done   Pneumococcal Vaccine: 50+ Years (2 of 2 - PCV) 07/23/2011   DTaP/Tdap/Td (3 - Td or Tdap) 06/05/2023   COVID-19 Vaccine (4 - 2024-25 season) 06/25/2023   INFLUENZA VACCINE  05/24/2024   MAMMOGRAM  05/08/2025   Cervical Cancer Screening (HPV/Pap Cotest)  04/05/2028   Colonoscopy  12/21/2030   HPV VACCINES  Aged Out   Meningococcal B Vaccine  Aged Out   Pneumococcal Vaccine  Discontinued   Hepatitis C Screening  Discontinued   Zoster Vaccines- Shingrix  Discontinued      Review of Systems Patient reports no vision/ hearing changes, adenopathy, weight change,  persistant/recurrent hoarseness , swallowing issues, chest pain, palpitations, edema, hemoptysis, dyspnea (rest/exertional/paroxysmal nocturnal), gastrointestinal bleeding (melena, rectal bleeding), abdominal pain, significant heartburn, bowel changes, GU symptoms (dysuria, hematuria, incontinence), Gyn symptoms (abnormal  bleeding, pain),  syncope, focal weakness, memory loss, numbness & tingling, skin/hair/nail changes, abnormal bruising or bleeding, anxiety, or depression.   + productive cough, nasal congestion, sore throat.  Has been traveling recently- returned 1 week ago.  Sxs started on Wednesday.  Outside Paxlovid  window.   + fever    Objective:   Physical Exam General Appearance:    Alert, cooperative, no distress, appears stated age  Head:    Normocephalic, without obvious abnormality, atraumatic   Eyes:    PERRL, conjunctiva/corneas clear, EOM's intact both eyes  Ears:    Normal TM's and external ear canals, both ears  Nose:   Nares normal, septum midline, mucosa normal, no drainage    or sinus tenderness  Throat:   Lips, mucosa, and tongue normal; teeth and gums normal  Neck:   Supple, symmetrical, trachea midline, no adenopathy;    Thyroid : no enlargement/tenderness/nodules  Back:     Symmetric, no curvature, ROM normal, no CVA tenderness  Lungs:     Clear to auscultation bilaterally, respirations unlabored  Chest Wall:    No tenderness or deformity   Heart:    Regular rate and rhythm, S1 and S2 normal, no murmur, rub   or gallop  Breast Exam:    Deferred to mammo  Abdomen:     Soft, non-tender, bowel sounds active all four quadrants,    no masses, no organomegaly  Genitalia:    Deferred to GYN  Rectal:    Extremities:   Extremities normal, atraumatic, no cyanosis or edema  Pulses:   2+ and symmetric all extremities  Skin:   Skin color, texture, turgor normal, no rashes or lesions  Lymph nodes:   Cervical, supraclavicular, and axillary nodes normal  Neurologic:   CNII-XII intact, normal strength, sensation and reflexes    throughout          Assessment & Plan:  COVID- new.  Pt's sxs and recent travel are consistent w/ dx.  Test was instantly +.  Outside window for Paxlovid .  Reviewed supportive care

## 2024-06-11 ENCOUNTER — Ambulatory Visit: Payer: Self-pay | Admitting: Family Medicine

## 2024-06-11 DIAGNOSIS — R7989 Other specified abnormal findings of blood chemistry: Secondary | ICD-10-CM

## 2024-06-11 LAB — HIV ANTIBODY (ROUTINE TESTING W REFLEX): HIV 1&2 Ab, 4th Generation: NONREACTIVE

## 2024-06-11 LAB — POCT COVID BINAXNOW CARD: SARS Coronavirus 2 Ag: POSITIVE — AB

## 2024-06-11 NOTE — Telephone Encounter (Signed)
 Called patient to relay lab results.  Patient was not able to schedule a 2 week lab visit with me. She said she will have to call back to schedule once she has her clinical schedule.   I have ordered future lab.

## 2024-06-11 NOTE — Telephone Encounter (Signed)
-----   Message from Comer Greet sent at 06/11/2024  7:26 AM EDT ----- Labs look great w/ exception of your ALT (a liver enzyme) which is mildly elevated.  This is likely viral inflammation and will improve w/ time.  We will repeat your liver functions at a lab only  visit in 2 weeks to make sure this is headed in the right direction (LFT's, dx- elevated LFTs) ----- Message ----- From: Interface, Lab In Three Zero One Sent: 06/10/2024   4:45 PM EDT To: Comer FORBES Greet, MD

## 2024-08-21 ENCOUNTER — Encounter (HOSPITAL_BASED_OUTPATIENT_CLINIC_OR_DEPARTMENT_OTHER): Payer: Self-pay | Admitting: Emergency Medicine

## 2024-08-21 ENCOUNTER — Emergency Department (HOSPITAL_BASED_OUTPATIENT_CLINIC_OR_DEPARTMENT_OTHER)

## 2024-08-21 ENCOUNTER — Other Ambulatory Visit: Payer: Self-pay

## 2024-08-21 ENCOUNTER — Emergency Department (HOSPITAL_COMMUNITY)

## 2024-08-21 ENCOUNTER — Emergency Department (HOSPITAL_BASED_OUTPATIENT_CLINIC_OR_DEPARTMENT_OTHER)
Admission: EM | Admit: 2024-08-21 | Discharge: 2024-08-21 | Disposition: A | Discharge status: Home / Self Care | Source: Ambulatory Visit | Attending: Emergency Medicine | Admitting: Emergency Medicine

## 2024-08-21 ENCOUNTER — Ambulatory Visit: Payer: Self-pay

## 2024-08-21 DIAGNOSIS — H538 Other visual disturbances: Secondary | ICD-10-CM

## 2024-08-21 DIAGNOSIS — Z7951 Long term (current) use of inhaled steroids: Secondary | ICD-10-CM | POA: Diagnosis not present

## 2024-08-21 DIAGNOSIS — Z91040 Latex allergy status: Secondary | ICD-10-CM | POA: Insufficient documentation

## 2024-08-21 DIAGNOSIS — Z79899 Other long term (current) drug therapy: Secondary | ICD-10-CM | POA: Diagnosis not present

## 2024-08-21 DIAGNOSIS — R202 Paresthesia of skin: Secondary | ICD-10-CM | POA: Diagnosis not present

## 2024-08-21 DIAGNOSIS — G43109 Migraine with aura, not intractable, without status migrainosus: Secondary | ICD-10-CM

## 2024-08-21 DIAGNOSIS — R42 Dizziness and giddiness: Secondary | ICD-10-CM | POA: Diagnosis present

## 2024-08-21 DIAGNOSIS — R791 Abnormal coagulation profile: Secondary | ICD-10-CM | POA: Diagnosis not present

## 2024-08-21 DIAGNOSIS — Z8616 Personal history of COVID-19: Secondary | ICD-10-CM | POA: Insufficient documentation

## 2024-08-21 DIAGNOSIS — J45909 Unspecified asthma, uncomplicated: Secondary | ICD-10-CM | POA: Diagnosis not present

## 2024-08-21 DIAGNOSIS — G43809 Other migraine, not intractable, without status migrainosus: Secondary | ICD-10-CM | POA: Diagnosis not present

## 2024-08-21 DIAGNOSIS — R200 Anesthesia of skin: Secondary | ICD-10-CM

## 2024-08-21 LAB — COMPREHENSIVE METABOLIC PANEL WITH GFR
ALT: 70 U/L — ABNORMAL HIGH (ref 0–44)
AST: 41 U/L (ref 15–41)
Albumin: 4.9 g/dL (ref 3.5–5.0)
Alkaline Phosphatase: 110 U/L (ref 38–126)
Anion gap: 13 (ref 5–15)
BUN: 15 mg/dL (ref 6–20)
CO2: 23 mmol/L (ref 22–32)
Calcium: 10.1 mg/dL (ref 8.9–10.3)
Chloride: 103 mmol/L (ref 98–111)
Creatinine, Ser: 0.86 mg/dL (ref 0.44–1.00)
GFR, Estimated: >60 mL/min (ref >60)
Glucose, Bld: 95 mg/dL (ref 70–99)
Potassium: 4 mmol/L (ref 3.5–5.1)
Sodium: 139 mmol/L (ref 135–145)
Total Bilirubin: 0.4 mg/dL (ref 0.0–1.2)
Total Protein: 7.4 g/dL (ref 6.5–8.1)

## 2024-08-21 LAB — PROTIME-INR
INR: 0.9 (ref 0.8–1.2)
Prothrombin Time: 12.3 s (ref 11.4–15.2)

## 2024-08-21 LAB — CBC
HCT: 38.7 % (ref 36.0–46.0)
Hemoglobin: 13.5 g/dL (ref 12.0–15.0)
MCH: 32.2 pg (ref 26.0–34.0)
MCHC: 34.9 g/dL (ref 30.0–36.0)
MCV: 92.4 fL (ref 80.0–100.0)
Platelets: 308 K/uL (ref 150–400)
RBC: 4.19 MIL/uL (ref 3.87–5.11)
RDW: 12 % (ref 11.5–15.5)
WBC: 7.4 K/uL (ref 4.0–10.5)
nRBC: 0 % (ref 0.0–0.2)

## 2024-08-21 LAB — DIFFERENTIAL
Abs Immature Granulocytes: 0.01 K/uL (ref 0.00–0.07)
Basophils Absolute: 0 K/uL (ref 0.0–0.1)
Basophils Relative: 0 %
Eosinophils Absolute: 0.1 K/uL (ref 0.0–0.5)
Eosinophils Relative: 1 %
Immature Granulocytes: 0 %
Lymphocytes Relative: 36 %
Lymphs Abs: 2.7 K/uL (ref 0.7–4.0)
Monocytes Absolute: 0.6 K/uL (ref 0.1–1.0)
Monocytes Relative: 7 %
Neutro Abs: 4.1 K/uL (ref 1.7–7.7)
Neutrophils Relative %: 56 %

## 2024-08-21 LAB — URINE DRUG SCREEN
Amphetamines: POSITIVE — AB
Barbiturates: NEGATIVE
Benzodiazepines: NEGATIVE
Cocaine: NEGATIVE
Fentanyl: NEGATIVE
Methadone Scn, Ur: NEGATIVE
Opiates: NEGATIVE
Tetrahydrocannabinol: NEGATIVE

## 2024-08-21 LAB — APTT: aPTT: 30 s (ref 24–36)

## 2024-08-21 LAB — CBG MONITORING, ED: Glucose-Capillary: 94 mg/dL (ref 70–99)

## 2024-08-21 MED ORDER — METOCLOPRAMIDE HCL 5 MG/ML IJ SOLN
10.0000 mg | Freq: Once | INTRAMUSCULAR | Status: AC
  Administered 2024-08-21: 10 mg via INTRAVENOUS
  Filled 2024-08-21: qty 2

## 2024-08-21 MED ORDER — IOHEXOL 350 MG/ML SOLN
100.0000 mL | Freq: Once | INTRAVENOUS | Status: AC | PRN
Start: 2024-08-21 — End: 2024-08-21
  Administered 2024-08-21: 75 mL via INTRAVENOUS

## 2024-08-21 MED ORDER — DIPHENHYDRAMINE HCL 50 MG/ML IJ SOLN
12.5000 mg | Freq: Once | INTRAMUSCULAR | Status: AC
  Administered 2024-08-21: 12.5 mg via INTRAVENOUS
  Filled 2024-08-21: qty 1

## 2024-08-21 NOTE — ED Notes (Signed)
 CBG 94

## 2024-08-21 NOTE — Discharge Instructions (Addendum)
 As we discussed your MRI is normal today.  Follow-up with your primary doctor in 2-3 days regarding your visit.  Follow-up with neurology.  Return immediately to the emergency department if you experience any of the following: Vision changes, numbness, weakness, difficulty speaking, or any other concerning symptoms.    Thank you for visiting our Emergency Department. It was a pleasure taking care of you today.

## 2024-08-21 NOTE — ED Triage Notes (Signed)
 Left side numbness Started around 3:45pm TMJ pain Blurry vision   Provider in triage

## 2024-08-21 NOTE — Consult Note (Signed)
 Stroke Neurology Consultation Note  Consult Requested by: Dr. Yolande  Reason for Consult: code stroke  Consult Date: 08/21/24   The history was obtained from the pt.  During history and examination, all items were able to obtain unless otherwise noted.  History of Present Illness:  Sheryl Nichols is a 53 y.o. Caucasian female with PMH of OSA, chronic rhinitis, hypothyroidism, history of COVID, asthma, anemia, ADHD, carpal tunnel syndrome and lumbar radiculopathy presented to ED for code stroke.  Patient stated that around 4:30 PM she had a sudden onset of feeling left facial numbness, more at left upper and lower lip tingling, and left eye left upper and lower visual field blurry vision with C-shaped halo.  She denies any change of vision on the right eye or left eye right visual field.  She also felt left the jaw TMJ painful sensation and left hand with left wrist mild painful sensation resembles her carpal tunnel syndrome.  She denies any speech difficulty, arm or leg weakness, mental status changes.  She denies any headache, nausea vomiting.  CT no acute abnormality.  By the time of CT completion, she states that she is back to normal.   She stated that she had migraine headache 22 years ago with nausea vomiting and photophobia denies phonophobia.  She denies any visual aura in the past. However for the last 22 years she has no headache.  This time she also had no headache.  LSN: 4:30 PM TNK Given: No: Symptoms resolved, likely ocular migraine IR Thrombectomy? No, symptom resolved, likely not stroke Modified Rankin Scale: 0-Completely asymptomatic and back to baseline post- stroke  Past Medical History:  Diagnosis Date   ADHD (attention deficit hyperactivity disorder), combined type 04/22/2021   Anemia 07/31/2007   Iron  deficiency ; onset as child pre menses; ?dietary deficiency related ( avoided meat , spinach, leafy greens,etc)   Asthma 08/22/2009   Onset:as infant Triggers  (environmental, infectious, allergic):allergens ; EIB Rescue inhaler ldz:mjmzob pre running Maintenance medications/ response:not used Smoking history:never Family history pulmonary disease: no   Carpal tunnel syndrome 06/04/2013   Improved with braces   Chronic rhinitis 09/13/2009   Dermatographic urticaria 09/06/2010   Heart murmur 07/31/2007   History of COVID-19 12/02/2020   History of Hashimoto thyroiditis    History of tympanoplasty 02/28/2023   Hyperhidrosis 02/21/2014   Lumbar radiculopathy 02/19/2021   Migraines    resolved off sugar   Recurrent urinary tract infection 08/22/2019    Past Surgical History:  Procedure Laterality Date   APPENDECTOMY     BREAST ENHANCEMENT SURGERY     BREAST REDUCTION SURGERY     with implant removal   COLONOSCOPY  2009   for rectal bleeding; IH found   SEPTOPLASTY     sinus cystectomy   TONSILLECTOMY AND ADENOIDECTOMY     TYMPANOPLASTY Bilateral    WISDOM TOOTH EXTRACTION      Family History  Problem Relation Age of Onset   Hyperlipidemia Mother    Hypertension Mother    Heart attack Mother 12   Hypothyroidism Mother    Cancer Maternal Grandfather        Brain Cancer   Diabetes Neg Hx    Stroke Neg Hx    Stomach cancer Neg Hx    Rectal cancer Neg Hx    Esophageal cancer Neg Hx    Colon cancer Neg Hx    Colon polyps Neg Hx     Social History:  reports that she has never smoked.  She has never used smokeless tobacco. She reports current alcohol use of about 14.0 - 21.0 standard drinks of alcohol per week. She reports that she does not use drugs.  Allergies:  Allergies  Allergen Reactions   Codeine     REACTION: tingling all over; / nausea & vomiting   Latex Hives    No current facility-administered medications on file prior to encounter.   Current Outpatient Medications on File Prior to Encounter  Medication Sig Dispense Refill   albuterol  (VENTOLIN  HFA) 108 (90 Base) MCG/ACT inhaler INHALE 1 TO 2 PUFFS BY MOUTH EVERY  4 TO 6 HOURS AS NEEDED FOR WHEEZING OR SHORTNESS OF BREATH 6.7 g 0   amphetamine -dextroamphetamine (ADDERALL XR) 25 MG 24 hr capsule Take 1 capsule by mouth every morning. 30 capsule 0   ARNUITY ELLIPTA  100 MCG/ACT AEPB Inhale 1 puff into the lungs daily.     cetirizine  (ZYRTEC ) 10 MG tablet TAKE 1 TABLET(10 MG) BY MOUTH DAILY 30 tablet 11   estradiol (VIVELLE-DOT) 0.05 MG/24HR patch Place 1 patch onto the skin 2 (two) times a week.     fluticasone  (FLONASE ) 50 MCG/ACT nasal spray SHAKE LIQUID AND USE 2 SPRAYS IN EACH NOSTRIL DAILY 16 g 3   iron  polysaccharides (NIFEREX) 150 MG capsule Take 1 capsule (150 mg total) by mouth daily. 90 capsule 1   phentermine (ADIPEX-P) 37.5 MG tablet Take 1 tablet by mouth daily.     predniSONE  (DELTASONE ) 10 MG tablet 3 tabs x3 days and then 2 tabs x3 days and then 1 tab x3 days.  Take w/ food. 18 tablet 0   progesterone (PROMETRIUM) 100 MG capsule Take 100 mg by mouth daily.     SYMBICORT  160-4.5 MCG/ACT inhaler INHALE 2 PUFFS INTO THE LUNGS TWICE DAILY 10.2 g 3   valACYclovir  (VALTREX ) 1000 MG tablet Take 2 tabs now and repeat in 12 hrs 4 tablet 3   Vitamin D , Ergocalciferol , (DRISDOL ) 1.25 MG (50000 UNIT) CAPS capsule Take 1 capsule (50,000 Units total) by mouth every 7 (seven) days. 7 capsule 12    Review of Systems: A full ROS was attempted today and was able to be performed.  Systems assessed include - Constitutional, Eyes, HENT, Respiratory, Cardiovascular, Gastrointestinal, Genitourinary, Integument/breast, Hematologic/lymphatic, Musculoskeletal, Neurological, Behavioral/Psych, Endocrine, Allergic/Immunologic - with pertinent responses as per HPI.  Physical Examination: Temp:  [98 F (36.7 C)] 98 F (36.7 C) (10/29 1633) Pulse Rate:  [86] 86 (10/29 1633) Resp:  [17] 17 (10/29 1633) BP: (163)/(89) 163/89 (10/29 1633) SpO2:  [100 %] 100 % (10/29 1633)  General - well nourished, well developed, in no apparent distress.    Ophthalmologic - fundi not  visualized due to noncooperation.    Cardiovascular - regular rhythm and rate  Mental Status -  Level of arousal and orientation to time, place, and person were intact. Language including expression, naming, repetition, comprehension was assessed and found intact. Recent and remote memory were intact. Fund of Knowledge was assessed and was intact.  Cranial Nerves II - XII - II - Vision intact OU. III, IV, VI - Extraocular movements intact. V - Facial sensation intact bilaterally. VII - Facial movement intact bilaterally. VIII - Hearing & vestibular intact bilaterally. X - Palate elevates symmetrically. XI - Chin turning & shoulder shrug intact bilaterally. XII - Tongue protrusion intact.  Motor Strength - The patient's strength was normal in all extremities and pronator drift was absent.   Motor Tone & Bulk - Muscle tone was assessed at the  neck and appendages and was normal.  Bulk was normal and fasciculations were absent.   Reflexes - The patient's reflexes were normal in all extremities and she had no pathological reflexes.  Sensory - Light touch, temperature/pinprick were assessed and were normal.    Coordination - The patient had normal movements in the hands and feet with no ataxia or dysmetria.  Tremor was absent.  Gait and Station - deferred  Data Reviewed: CT ANGIO HEAD NECK W WO CM (CODE STROKE) Result Date: 08/21/2024 EXAM: CTA Head and Neck with Intravenous Contrast. CT Head without Contrast. CLINICAL HISTORY: Neuro deficit, acute, stroke suspected; vision changes, L sided numbness. TECHNIQUE: Axial CTA images of the head and neck performed with intravenous contrast. MIP reconstructed images were created and reviewed. Axial computed tomography images of the head/brain performed without intravenous contrast. Note: Per PQRS, the description of internal carotid artery percent stenosis, including 0 percent or normal exam, is based on [place] American Symptomatic Carotid  Endarterectomy Trial (NASCET) criteria. Dose reduction technique was used including one or more of the following: automated exposure control, adjustment of mA and kV according to patient size, and/or iterative reconstruction. CONTRAST: Without and with; 75mL (iohexol (OMNIPAQUE) 350 MG/ML injection 100 mL IOHEXOL 350 MG/ML SOLN) COMPARISON: None provided. FINDINGS: COMMON CAROTID ARTERIES: No significant stenosis. No dissection or occlusion. INTERNAL CAROTID ARTERIES: No stenosis by NASCET criteria. No dissection or occlusion. VERTEBRAL ARTERIES: No significant stenosis. No dissection or occlusion. ANTERIOR CEREBRAL ARTERIES: No significant stenosis. No occlusion. No aneurysm. MIDDLE CEREBRAL ARTERIES: No significant stenosis. No occlusion. No aneurysm. POSTERIOR CEREBRAL ARTERIES: No significant stenosis. No occlusion. No aneurysm. BASILAR ARTERY: No significant stenosis. No occlusion. No aneurysm. SOFT TISSUES: No acute finding. BONES: No acute osseous abnormality. IMPRESSION: 1. No evidence of large vessel occlusion or significant stenosis. Electronically signed by: Gilmore Molt MD 08/21/2024 05:26 PM EDT RP Workstation: [id number]   CT HEAD CODE STROKE WO CONTRAST (LKW 0-4.5h, LVO 0-24h) Result Date: 08/21/2024 EXAM: CT HEAD WITHOUT 08/21/2024 04:51:24 PM TECHNIQUE: CT of the head was performed without the administration of intravenous contrast. Automated exposure control, iterative reconstruction, and/or weight based adjustment of the mA/kV was utilized to reduce the radiation dose to as low as reasonably achievable. COMPARISON: None available. CLINICAL HISTORY: Neuro deficit, acute, stroke suspected. Code stroke; Left side numbness; Started around 3:45pm; TMJ pain; Blurry vision; Attending MD Yolande [phone] FINDINGS: BRAIN AND VENTRICLES: No acute intracranial hemorrhage. No mass effect or midline shift. No extra-axial fluid collection. No hydrocephalus. [place] stroke program early CT (aspect)  score: Ganglionic (caudate, IC, lentiform nucleus, insula, M1-M3): 7 Supraganglionic (M4-M6): 3 Total: 10 ORBITS: No acute abnormality. SINUSES AND MASTOIDS: Mucosal thickening in the partially visualized left maxillary sinus postsurgical changes of the left aspect of the nasal cavity. SOFT TISSUES AND SKULL: No acute skull fracture. No acute soft tissue abnormality. IMPRESSION: 1. No acute intracranial abnormality. 2. ASPECTS 10. 3. Findings discussed with Dr. Yolande at 4:59PM on 08/21/24. Electronically signed by: Donnice Mania MD 08/21/2024 04:59 PM EDT RP Workstation: [id number]    Assessment: 53 y.o. female with PMH of OSA, chronic rhinitis, hypothyroidism, history of COVID, asthma, anemia, ADHD, carpal tunnel syndrome and lumbar radiculopathy presented to ED for symptoms typical for an ocular migraine. CT and CTA head and neck unremarkable. However, this is the first time pt has visual symptoms, and without HA. Her last HA was 22 years ago. Given this, will recommend MRI brain without contrast to rule out stroke. If negative, she can  go from neuro standpoint with close neurology outpt follow up.   Stroke Risk Factors - none  Plan: - MRI brain without contrast - If negative, she can go from neuro standpoint with close neurology outpt follow up. - will follow as needed.  Thank you for this consultation and allowing us  to participate in the care of this patient.  Ary Cummins, MD PhD Stroke Neurology 08/21/2024 5:40 PM

## 2024-08-21 NOTE — ED Provider Notes (Signed)
  Physical Exam  BP (!) 148/84   Pulse 96   Temp 98.4 F (36.9 C)   Resp 17   Ht 5' 4 (1.626 m)   Wt 68 kg   SpO2 100%   BMI 25.75 kg/m   Physical Exam  Procedures  Procedures  ED Course / MDM   Clinical Course as of 08/21/24 2234  Wed Aug 21, 2024  1712 Dr Jerri feel that symptoms are likely from a complex migraine but recommends vessel imaging and transfer to [facility] for MRI without contrast.  [RP]  1811 Still having some changed sensation on the L side of her face but other symptoms have improved.  [RP]    Clinical Course User Index [RP] Sheryl Lamar BROCKS, MD   Medical Decision Making Care assumed upon transfer from [facility].  Patient is here with left facial numbness.  Patient was seen by teleneurology and recommend MRI brain.  10:35 PM MRI showed no stroke.  Patient likely has complex migraine.  Will refer to neurology outpatient  Problems Addressed: Blurry vision: acute illness or injury Complicated migraine: acute illness or injury Left facial numbness: acute illness or injury  Amount and/or Complexity of Data Reviewed Labs: ordered. Decision-making details documented in ED Course. Radiology: ordered and independent interpretation performed. Decision-making details documented in ED Course.  Risk Prescription drug management.          Patt Alm Macho, MD 08/21/24 [phone]

## 2024-08-21 NOTE — ED Provider Notes (Signed)
 Sheryl Nichols   CSN: [id number] Arrival date & time: 08/21/24  1626  An emergency department physician performed an initial assessment on this suspected stroke patient at 1634.  Patient presents with: No chief complaint on file.   Sheryl Nichols is a 53 y.o. female.   53 year old female with a history of migraines who presents to the emergency department with blurry vision, dizziness, facial numbness, and tingling in her hand.  Patient reports that at 3:45 PM Sheryl Nichols was sitting in class when Sheryl Nichols started having vision changes that looked like a kaleidoscope out of the left side of her eye.  Said that Sheryl Nichols also started experiencing some numbness to the left side of her face as well.  Was feeling dizzy.  Vision changes have resolved but still having the numbness on the left side of her face and has started having some paresthesias in her left hand as well.  No history of similar events previously.  No headaches.  No history of stroke.  Not on blood thinners       Prior to Admission medications   Medication Sig Start Date End Date Taking? Authorizing Provider  albuterol  (VENTOLIN  HFA) 108 (90 Base) MCG/ACT inhaler INHALE 1 TO 2 PUFFS BY MOUTH EVERY 4 TO 6 HOURS AS NEEDED FOR WHEEZING OR SHORTNESS OF BREATH 01/24/22   Tabori, Katherine E, MD  amphetamine -dextroamphetamine (ADDERALL XR) 25 MG 24 hr capsule Take 1 capsule by mouth every morning. 05/07/24   Jerrell Cleatus Ned, MD  ARNUITY ELLIPTA  100 MCG/ACT AEPB Inhale 1 puff into the lungs daily. 09/19/23   [provider]  cetirizine  (ZYRTEC ) 10 MG tablet TAKE 1 TABLET(10 MG) BY MOUTH DAILY 06/07/24   Tabori, Katherine E, MD  estradiol (VIVELLE-DOT) 0.05 MG/24HR patch Place 1 patch onto the skin 2 (two) times a week. 02/29/24   [provider]  fluticasone  (FLONASE ) 50 MCG/ACT nasal spray SHAKE LIQUID AND USE 2 SPRAYS IN EACH NOSTRIL DAILY 12/01/23   Tabori, Katherine E, MD   iron  polysaccharides (NIFEREX) 150 MG capsule Take 1 capsule (150 mg total) by mouth daily. 01/22/24   Jerrell Cleatus Ned, MD  phentermine (ADIPEX-P) 37.5 MG tablet Take 1 tablet by mouth daily. 02/26/24   [provider]  predniSONE  (DELTASONE ) 10 MG tablet 3 tabs x3 days and then 2 tabs x3 days and then 1 tab x3 days.  Take w/ food. 03/20/24   Tabori, Katherine E, MD  progesterone (PROMETRIUM) 100 MG capsule Take 100 mg by mouth daily. 02/29/24   [provider]  SYMBICORT  160-4.5 MCG/ACT inhaler INHALE 2 PUFFS INTO THE LUNGS TWICE DAILY 01/29/24   Tabori, Katherine E, MD  valACYclovir  (VALTREX ) 1000 MG tablet Take 2 tabs now and repeat in 12 hrs 05/27/22   Tabori, Katherine E, MD  Vitamin D , Ergocalciferol , (DRISDOL ) 1.25 MG (50000 UNIT) CAPS capsule Take 1 capsule (50,000 Units total) by mouth every 7 (seven) days. 05/08/23   Mahlon Comer BRAVO, MD    Allergies: Codeine and Latex    Review of Systems  Updated Vital Signs BP (!) 163/89 (BP Location: Right Arm)   Pulse 86   Temp 98 F (36.7 C) (Oral)   Resp 17   SpO2 100%   Physical Exam Constitutional:      Appearance: Normal appearance.  Cardiovascular:     Rate and Rhythm: Normal rate and regular rhythm.     Pulses: Normal pulses.     Heart sounds: Normal heart sounds.  Pulmonary:     Effort: Pulmonary effort is normal.     Breath sounds: Normal breath sounds.  Neurological:     Mental Status: Sheryl Nichols is alert.     Comments: NIHSS Exam  Level of Consciousness: Alert  LOC Questions: Answers Month and Age Correctly  LOC Commands: Opens and Closes Eyes and Hands on command  Best Gaze: Horizontal ocular movements intact  Visual Fields: No visual field loss  Facial Palsy: Mild nasolabial flattening on the left L Upper Extremity Motor: No drift after 10 seconds  R Upper Extremity Motor: No drift after 10 seconds  L Lower extremity Motor: No drift after 5 seconds  R Lower extremity Motor: No drift after 5 seconds   Ataxia: Absent  Sensory: Intact sensation to light touch on arms, trunk, and legs bilaterally.  Diminished sensation to light touch in V2 distribution of the left side of the face Best Language: No aphasia  Dysarthria: No dysarthria  Neglect: No visual or sensory neglect        (all labs ordered are listed, but only abnormal results are displayed) Labs Reviewed  COMPREHENSIVE METABOLIC PANEL WITH GFR - Abnormal; Notable for the following components:      Result Value   ALT 70 (*)    All other components within normal limits  PROTIME-INR  APTT  CBC  DIFFERENTIAL  URINE DRUG SCREEN  CBG MONITORING, ED    EKG: None  Radiology: CT ANGIO HEAD NECK W WO CM (CODE STROKE) Result Date: 08/21/2024 EXAM: CTA Head and Neck with Intravenous Contrast. CT Head without Contrast. CLINICAL HISTORY: Neuro deficit, acute, stroke suspected; vision changes, L sided numbness. TECHNIQUE: Axial CTA images of the head and neck performed with intravenous contrast. MIP reconstructed images were created and reviewed. Axial computed tomography images of the head/brain performed without intravenous contrast. Nichols: Per PQRS, the description of internal carotid artery percent stenosis, including 0 percent or normal exam, is based on [employer] American Symptomatic Carotid Endarterectomy Trial (NASCET) criteria. Dose reduction technique was used including one or more of the following: automated exposure control, adjustment of mA and kV according to patient size, and/or iterative reconstruction. CONTRAST: Without and with; 75mL (iohexol (OMNIPAQUE) 350 MG/ML injection 100 mL IOHEXOL 350 MG/ML SOLN) COMPARISON: None provided. FINDINGS: COMMON CAROTID ARTERIES: No significant stenosis. No dissection or occlusion. INTERNAL CAROTID ARTERIES: No stenosis by NASCET criteria. No dissection or occlusion. VERTEBRAL ARTERIES: No significant stenosis. No dissection or occlusion. ANTERIOR CEREBRAL ARTERIES: No significant stenosis. No  occlusion. No aneurysm. MIDDLE CEREBRAL ARTERIES: No significant stenosis. No occlusion. No aneurysm. POSTERIOR CEREBRAL ARTERIES: No significant stenosis. No occlusion. No aneurysm. BASILAR ARTERY: No significant stenosis. No occlusion. No aneurysm. SOFT TISSUES: No acute finding. BONES: No acute osseous abnormality. IMPRESSION: 1. No evidence of large vessel occlusion or significant stenosis. Electronically signed by: Gilmore Molt MD 08/21/2024 05:26 PM EDT RP Workstation: [id number]   CT HEAD CODE STROKE WO CONTRAST (LKW 0-4.5h, LVO 0-24h) Result Date: 08/21/2024 EXAM: CT HEAD WITHOUT 08/21/2024 04:51:24 PM TECHNIQUE: CT of the head was performed without the administration of intravenous contrast. Automated exposure control, iterative reconstruction, and/or weight based adjustment of the mA/kV was utilized to reduce the radiation dose to as low as reasonably achievable. COMPARISON: None available. CLINICAL HISTORY: Neuro deficit, acute, stroke suspected. Code stroke; Left side numbness; Started around 3:45pm; TMJ pain; Blurry vision; Attending MD Yolande [phone] FINDINGS: BRAIN AND VENTRICLES: No acute intracranial hemorrhage. No mass effect or midline shift. No extra-axial fluid collection. No  hydrocephalus. [place] stroke program early CT (aspect) score: Ganglionic (caudate, IC, lentiform nucleus, insula, M1-M3): 7 Supraganglionic (M4-M6): 3 Total: 10 ORBITS: No acute abnormality. SINUSES AND MASTOIDS: Mucosal thickening in the partially visualized left maxillary sinus postsurgical changes of the left aspect of the nasal cavity. SOFT TISSUES AND SKULL: No acute skull fracture. No acute soft tissue abnormality. IMPRESSION: 1. No acute intracranial abnormality. 2. ASPECTS 10. 3. Findings discussed with Dr. Yolande at 4:59PM on 08/21/24. Electronically signed by: Donnice Mania MD 08/21/2024 04:59 PM EDT RP Workstation: [id number]     Procedures   Medications Ordered in the ED  metoCLOPramide  (REGLAN) injection 10 mg (10 mg Intravenous Given 08/21/24 1741)  diphenhydrAMINE  (BENADRYL ) injection 12.5 mg (12.5 mg Intravenous Given 08/21/24 1741)  iohexol (OMNIPAQUE) 350 MG/ML injection 100 mL (75 mLs Intravenous Contrast Given 08/21/24 1706)    Clinical Course as of 08/21/24 1831  Wed Aug 21, 2024  1712 Dr Jerri feel that symptoms are likely from a complex migraine but recommends vessel imaging and transfer to [facility] for MRI without contrast.  [RP]  1811 Still having some changed sensation on the L side of her face but other symptoms have improved.  [RP]    Clinical Course User Index [RP] Yolande Lamar BROCKS, MD                                 Medical Decision Making Amount and/or Complexity of Data Reviewed Labs: ordered. Radiology: ordered.  Risk Prescription drug management.   Floria Tomaso is a 53 year old female with a history of migraines who presents to the emergency department with blurry vision, dizziness, facial numbness, and tingling in her hand.   Initial Ddx:  Stroke, ICH, complicated migraine, ocular migraine  MDM/Course:  Patient presents emergency department with blurry vision, facial numbness, dizziness, and tingling in her hand.  Not having any significant headache.  No history of prior events that are similar to this.  No history of stroke.  Concerned about possible complex migraine but given the fact that this is a first-time occurrence and Sheryl Nichols is within the window for intervention for stroke code stroke was activated.  Was seen by Dr. Jerri from neurology who felt that symptoms were too mild to treat with TNK.  CTA and CT head without acute abnormality.  Given migraine cocktail.  Neurology recommends transfer for MRI and discharge home if negative.  If positive neurology will need to be reconsulted and patient potentially admitted.  Did discuss possibility of MS as well but he felt that with the rapid change in her symptoms this was less likely demyelinating  disease.  Upon re-evaluation symptoms and mostly resolved aside from some mild tingling in the left side of her face.  Accepted by Dr. Ruthe for transfer to [facility] for the MRI  This patient presents to the ED for concern of complaints listed in HPI, this involves an extensive number of treatment options, and is a complaint that carries with it a high risk of complications and morbidity. Disposition including potential need for admission considered.   Dispo: DC Home. Return precautions discussed including, but not limited to, those listed in the AVS. Allowed pt time to ask questions which were answered fully prior to dc.  Additional history obtained from friend Records reviewed Outpatient Clinic Notes The following labs were independently interpreted: Chemistry and show no acute abnormality I independently reviewed the following imaging with scope of interpretation  limited to determining acute life threatening conditions related to emergency care: CT Head and agree with the radiologist interpretation with the following exceptions: none I personally reviewed and interpreted cardiac monitoring: normal sinus rhythm  I personally reviewed and interpreted the pt's EKG: see above for interpretation  I have reviewed the patients home medications and made adjustments as needed Consults: Neurology  Portions of this Nichols were generated with Dragon dictation software. Dictation errors may occur despite best attempts at proofreading.     Final diagnoses:  Blurry vision  Left facial numbness    ED Discharge Orders     None          Yolande Lamar BROCKS, MD 08/21/24 [phone]

## 2024-08-21 NOTE — ED Triage Notes (Signed)
 PT arrives via POV as a transfer from [facility]. Pt reports numbness to left upper lip, 4 fingers on her left hand, and left shoulder that started around 1450. Pt reports she did have blurred vision, but it has resolved. No other focal deficits noted.

## 2024-08-21 NOTE — Telephone Encounter (Signed)
 FYI Only or Action Required?: FYI only for provider: ED advised.  Patient was last seen in primary care on 06/10/2024 by Mahlon Comer BRAVO, MD.  Called Nurse Triage reporting Neurologic Problem.  Symptoms began about 30 minutes ago.  Interventions attempted: Nothing.  Symptoms are: unchanged.  Triage Disposition: Call EMS 911 Now  Patient/caregiver understands and will follow disposition?: Yes, patient is at work and will have someone call 911 or take her to the ED     Copied from CRM #[id number]. Topic: Clinical - Red Word Triage >> Aug 21, 2024  3:22 PM Burnard DEL wrote: Red Word that prompted transfer to Nurse Triage: vision issues,blurred vision,weird sensation in TMJ area,upper lip numbness       Reason for Disposition  [1] Numbness (i.e., loss of sensation) of the face, arm / hand, or leg / foot on one side of the body AND [2] sudden onset AND [3] present now  Answer Assessment - Initial Assessment Questions 1. SYMPTOM: What is the main symptom you are concerned about? (e.g., weakness, numbness)     Left sided lip numbness 2. ONSET: When did this start? (e.g., minutes, hours, days; while sleeping)     30 minutes ago 3. LAST NORMAL: When was the last time you (the patient) were normal (no symptoms)?     30 minutes ago  4. PATTERN Does this come and go, or has it been constant since it started?  Is it present now?     Constant  5. CARDIAC SYMPTOMS: Have you had any of the following symptoms: chest pain, difficulty breathing, palpitations?     No 6. NEUROLOGIC SYMPTOMS: Have you had any of the following symptoms: headache, dizziness, vision loss, double vision, changes in speech, unsteady on your feet?     Vision changes in left eye  7. OTHER SYMPTOMS: Do you have any other symptoms?     Left sided jaw pain  Protocols used: Neurologic Deficit-A-AH

## 2024-08-21 NOTE — ED Notes (Signed)
 Pt in MRI.

## 2024-08-21 NOTE — ED Notes (Signed)
 IV wrapped with Kerlex

## 2024-08-21 NOTE — ED Notes (Signed)
 When pt was in CT stroke scale was 0, pt felt as if symptoms have resolved. Upon reassessment in the room the pt was having decreased sensation on the left side again and decreased vision in the left peripheral. Giving stroke scale 2 now.

## 2024-08-22 ENCOUNTER — Other Ambulatory Visit: Payer: Self-pay | Admitting: Family Medicine

## 2024-08-22 ENCOUNTER — Encounter: Payer: Self-pay | Admitting: Neurology

## 2024-08-22 ENCOUNTER — Telehealth: Payer: Self-pay

## 2024-08-22 DIAGNOSIS — F902 Attention-deficit hyperactivity disorder, combined type: Secondary | ICD-10-CM

## 2024-08-22 MED ORDER — AMPHETAMINE-DEXTROAMPHET ER 25 MG PO CP24: 25.0000 mg | ORAL_CAPSULE | ORAL | 0 refills | Status: DC

## 2024-08-22 NOTE — Telephone Encounter (Signed)
 Requested Prescriptions   Pending Prescriptions Disp Refills   amphetamine -dextroamphetamine (ADDERALL XR) 25 MG 24 hr capsule 30 capsule 0    Sig: Take 1 capsule by mouth every morning.     Date of patient request: 08/22/24 Last office visit: 06/10/2024 Upcoming visit: 09/04/2024 Date of last refill: 05/07/24 Last refill amount: 30

## 2024-08-22 NOTE — Telephone Encounter (Signed)
 Copied from CRM [id number]. Topic: Clinical - Medical Advice >> Aug 22, 2024  4:13 PM Nessti S wrote: Reason for CRM: pt was diagnosed with complicated migraines but she think she has TIS because of the symptoms. Would like referral to see a neurologist. Call back number [phone]

## 2024-08-22 NOTE — Telephone Encounter (Signed)
 Copied from CRM [id number]. Topic: Clinical - Medication Refill >> Aug 22, 2024  4:11 PM Nessti S wrote: Medication: amphetamine -dextroamphetamine (ADDERALL XR) 25 MG 24 hr capsule  Has the patient contacted their pharmacy? No (Agent: If no, request that the patient contact the pharmacy for the refill. If patient does not wish to contact the pharmacy document the reason why and proceed with request.) (Agent: If yes, when and what did the pharmacy advise?)  This is the patient's preferred pharmacy:  [id number] - [place], [place] - [address] [place]  [address] Phone: [phone] Fax: [phone]  Is this the correct pharmacy for this prescription? Yes If no, delete pharmacy and type the correct one.   Has the prescription been filled recently? No  Is the patient out of the medication? Yes  Has the patient been seen for an appointment in the last year OR does the patient have an upcoming appointment? Yes  Can we respond through MyChart? No  Agent: Please be advised that Rx refills may take up to 3 business days. We ask that you follow-up with your pharmacy.

## 2024-08-23 NOTE — Telephone Encounter (Signed)
 Sending to Dr. Mahlon as an FYI

## 2024-08-27 NOTE — Progress Notes (Unsigned)
 Initial neurology clinic note  Sheryl Nichols MRN: [id number] DOB: Apr 13, 1971  Referring provider: Mahlon Comer BRAVO, MD  Primary care provider: Mahlon Comer BRAVO, MD  Reason for consult:  vision changes  Subjective:  This is [name], a 53 y.o. right-handed female with a medical history of HLD, ADHD, asthma, carpal tunnel syndrome, remote history of migraines who presents to neurology clinic with vision changes. The patient is alone today.  Patient presented to ED on 08/21/24 with blurry vision, dizziness, facial numbness, and tingling in her hand. It started suddenly with kaleidoscope vision changes out of the left side of eye. She was seeing the letter C in her vision. It was only in the left eye. She then had numbness on the left side of her face (upper lip most but even on the cheek). She had pain and odd sensation in her ear/TMJ area.  Patient had dizziness once she was at the ED, but she thinks drowsiness was a better description. She then had tingling in her left hand.  Symptoms started resolving in the order it started per patient. The vision changes with the C in her vision lasted about 4 hours. She thought the vision was completely normal (told them that at the hospital). When she woke up the next morning, she feels there is a shadow or hair in the left upper lateral quadrant that she feels like she can't get rid of. She denies not being able to see in that area and does not think she has a visual field cut.  She was seen by Dr. Jerri in neurology who felt symptoms were more consistent with complex migraine. She never had a headache, photophobia, phonophobia, or nausea. Per notes, she had migraines in the past during pregnancy with nausea, vomiting and phonophobia. No headache in the last 22 years though.  CT head, CTA head and neck, and MRI brain were all normal.  Since the ED visit, she has also had some cognitive issues. Patient's roommate provided notes  to patient to read to me.  This note mentions her words could be slurred, her word finding difficulty, processing speed, and retelling of events was broken up. She was really ADHD at the [facility] party and seemed all over the place to guests.  Patient has noticed having a hard time concentrating on what people are saying. She is in school feels that her work at school has been affected. She feels confused. She has been very tired. She attributes this to cutting out caffeine and EtOH since the ED visit. She endorses good sleep and if anything, sleep has been more than before the ED visit.  In terms of mood, she does endorse being very anxious and on edge, worried about the next shoe to drop. This started in 05/2024 when she started school. She is seeing a therapist every other week, but unsure if this is helping. She does think she has attention problems.  She also mentions her head feels full. She thinks it is dull pressure and does not really hurt.  Smoker: No OCP/hormone use: Estrogen patch and progesterone pills for hot flashes. She stopped these after the recent ED visit.  Caffiene use: Stopped recently after ED, but prior to this 1-2 cups of coffee per day EtOH use: Stopped recently after ED, but prior was drinking 1-2 glasses of wine per night Restrictive diet: Not eating as well since starting school. She has not lost weight, but gained weight. Family history of neurologic disease including headaches:  daughter with migraines as teenager; sister may get headaches  She sees an eye doctor yearly, last 04/14/24. Everything looked good at that time per patient.  MEDICATIONS:  Outpatient Encounter Medications as of 08/28/2024  Medication Sig   albuterol  (VENTOLIN  HFA) 108 (90 Base) MCG/ACT inhaler INHALE 1 TO 2 PUFFS BY MOUTH EVERY 4 TO 6 HOURS AS NEEDED FOR WHEEZING OR SHORTNESS OF BREATH   amphetamine -dextroamphetamine (ADDERALL XR) 25 MG 24 hr capsule Take 1 capsule by mouth every  morning.   cetirizine  (ZYRTEC ) 10 MG tablet TAKE 1 TABLET(10 MG) BY MOUTH DAILY   fluticasone  (FLONASE ) 50 MCG/ACT nasal spray SHAKE LIQUID AND USE 2 SPRAYS IN EACH NOSTRIL DAILY   SYMBICORT  160-4.5 MCG/ACT inhaler INHALE 2 PUFFS INTO THE LUNGS TWICE DAILY   valACYclovir  (VALTREX ) 1000 MG tablet Take 2 tabs now and repeat in 12 hrs (Patient taking differently: as needed. Take 2 tabs now and repeat in 12 hrs)   ARNUITY ELLIPTA  100 MCG/ACT AEPB Inhale 1 puff into the lungs daily. (Patient not taking: Reported on 08/28/2024)   estradiol (VIVELLE-DOT) 0.05 MG/24HR patch Place 1 patch onto the skin 2 (two) times a week. (Patient not taking: Reported on 08/28/2024)   iron  polysaccharides (NIFEREX) 150 MG capsule Take 1 capsule (150 mg total) by mouth daily. (Patient not taking: Reported on 08/28/2024)   phentermine (ADIPEX-P) 37.5 MG tablet Take 1 tablet by mouth daily. (Patient not taking: Reported on 08/28/2024)   predniSONE  (DELTASONE ) 10 MG tablet 3 tabs x3 days and then 2 tabs x3 days and then 1 tab x3 days.  Take w/ food. (Patient not taking: Reported on 08/28/2024)   progesterone (PROMETRIUM) 100 MG capsule Take 100 mg by mouth daily. (Patient not taking: Reported on 08/28/2024)   Vitamin D , Ergocalciferol , (DRISDOL ) 1.25 MG (50000 UNIT) CAPS capsule Take 1 capsule (50,000 Units total) by mouth every 7 (seven) days. (Patient not taking: Reported on 08/28/2024)   No facility-administered encounter medications on file as of 08/28/2024.    PAST MEDICAL HISTORY: Past Medical History:  Diagnosis Date   ADHD (attention deficit hyperactivity disorder), combined type 04/22/2021   Anemia 07/31/2007   Iron  deficiency ; onset as child pre menses; ?dietary deficiency related ( avoided meat , spinach, leafy greens,etc)   Asthma 08/22/2009   Onset:as infant Triggers (environmental, infectious, allergic):allergens ; EIB Rescue inhaler ldz:mjmzob pre running Maintenance medications/ response:not used Smoking  history:never Family history pulmonary disease: no   Carpal tunnel syndrome 06/04/2013   Improved with braces   Chronic rhinitis 09/13/2009   Dermatographic urticaria 09/06/2010   Heart murmur 07/31/2007   History of COVID-19 12/02/2020   History of Hashimoto thyroiditis    History of tympanoplasty 02/28/2023   Hyperhidrosis 02/21/2014   Lumbar radiculopathy 02/19/2021   Migraines    resolved off sugar   Recurrent urinary tract infection 08/22/2019    PAST SURGICAL HISTORY: Past Surgical History:  Procedure Laterality Date   APPENDECTOMY     BREAST ENHANCEMENT SURGERY     BREAST REDUCTION SURGERY     with implant removal   COLONOSCOPY  2009   for rectal bleeding; IH found   SEPTOPLASTY     sinus cystectomy   TONSILLECTOMY AND ADENOIDECTOMY     TYMPANOPLASTY Bilateral    WISDOM TOOTH EXTRACTION      ALLERGIES: Allergies  Allergen Reactions   Codeine     REACTION: tingling all over; / nausea & vomiting   Latex Hives    FAMILY HISTORY: Family History  Problem Relation Age of Onset   Hyperlipidemia Mother    Hypertension Mother    Heart attack Mother 55   Hypothyroidism Mother    Cancer Maternal Grandfather        Brain Cancer   Diabetes Neg Hx    Stroke Neg Hx    Stomach cancer Neg Hx    Rectal cancer Neg Hx    Esophageal cancer Neg Hx    Colon cancer Neg Hx    Colon polyps Neg Hx     SOCIAL HISTORY: Social History   Tobacco Use   Smoking status: Never   Smokeless tobacco: Never  Vaping Use   Vaping status: Never Used  Substance Use Topics   Alcohol use: Yes    Alcohol/week: 14.0 - 21.0 standard drinks of alcohol    Types: 14 - 21 Shots of liquor per week    Comment: 2-3 shots of vodka nightly-not sents 08/21/24   Drug use: No   Social History   Social History Narrative   Born in [place], moved to [place]  as a teen   Separated-husband is transport planner, 4 children   Are you right handed or left handed? Right    Are you currently employed ?    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   Who lives with you? Roommate and child   What type of home do you live in: 1 story or 2 story? two    Caffiene none    Objective:  Vital Signs:  BP 125/84   Pulse 92   Ht 5' 4.5 (1.638 m)   Wt 168 lb (76.2 kg)   SpO2 98%   BMI 28.39 kg/m   General: No acute distress.  Patient appears well-groomed.   Head:  Normocephalic/atraumatic Eyes:  fundi examined, disc margins clear, no obvious papilledema or other lesions seen, particularly in left eye Neck: supple Back: No paraspinal tenderness Heart: regular rate and rhythm Lungs: Clear to auscultation bilaterally. Vascular: No carotid bruits.  Neurological Exam: Mental status: alert and oriented, speech fluent and not dysarthric, language intact.  Cranial nerves: CN I: not tested CN II: pupils equal, round and reactive to light, visual fields intact CN III, IV, VI:  full range of motion, no nystagmus, no ptosis. No curtain sign. Diplopia with up gaze CN V: facial sensation diminished in left V2 and V3 distribution to pinprick, but intact in V1. Vibration feels similar on both sides CN VII: upper and lower face symmetric. Orbicularis oris and orbicularis oculi strong bilaterally. CN VIII: hearing intact CN IX, X: uvula midline CN XI: sternocleidomastoid and trapezius muscles intact CN XII: tongue midline  Bulk & Tone: normal, no fasciculations. Motor:  muscle strength 5/5 throughout Deep Tendon Reflexes:  2+ throughout,  toes downgoing.   Sensation:  Light touch+ sensation intact. Finger to nose testing:  Without dysmetria.    Gait:  Normal station and stride.  Romberg negative.   Labs and Imaging review: Internal labs: 08/21/24: CMP significant for ALT 70 CBC unremarkable  06/10/24: Vit D wnl TSH wnlssssssss Lipid panel: tChol 216, LDL 124, TG 94.0  HbA1c (01/19/24): 5.6  Imaging/Procedures: MRI lumbar spine wo contrast (01/11/2007): IMPRESSION:   1. Broad-based central disk protrusion at L5-S1 results in possible encroachment on both S1 nerve roots.   2. No other acquired abnormalities. The right L4 and L5 nerve root sleeves appear conjoined.   CT head wo contrast (08/21/24): IMPRESSION: 1. No acute  intracranial abnormality. 2. ASPECTS 10.  CTA head and neck (08/21/24): No evidence of large vessel occlusion or significant stenosis.   MRI brain wo contrast (08/21/24): Normal brain MRI. No acute intracranial abnormality.   Assessment/Plan:  Sheryl Nichols is a 53 y.o. female who presents for evaluation of vision changes in left eye, numbness of left face, and cognitive changes. She has a relevant medical history of HLD, ADHD, asthma, carpal tunnel syndrome, remote history of migraines. Her neurological examination is pertinent for diminished sensation in V2 and V3 distribution of left face and diplopia with up gaze. Available diagnostic data is significant for normal CT head, CTA head and neck, and MRI brain. The etiology of symptoms is currently unclear and difficult to localize. The C in her vision followed by other symptoms sound like migraine with aura. She has a remote history of migraine, but no headaches in over 20 years and no headache with this episode, so this is possible but unclear. She had no other typical migraine symptoms either. MRI brain and CTA head and neck are not concerning for ischemia (stroke or TIA) but again, this is possible. I would expect the facial numbness to be more dense than just part of the face though. I also would not expect the aura symptom prior to onset. She made many changes after the episode on 08/21/24 including stopping her hormone replacement suddenly, cutting out caffeine and alcohol. She also has anxiety that has been worse after 08/21/24 episode. It is possible that these other changes contributed to fatigue and cognitive changes. A primary eye problem is also possible given that changes are  isolated to left eye, but this would not explain facial numbness. Myasthenia gravis is also possible, but again would not cause facial numbness. I will attempt to clarify things with the work up below and follow patient closely.  PLAN: -Blood work: B1, B12, TSH, vit D, MG panel (AChR abs), CMP -Recommend patient see her eye doctor for symptoms as well -Recommend she discuss hormone therapy with PCP or OBGYN -Stroke warning signs discussed -Will give sample of Nurtec 75 mg daily today to try to take if she has symptoms again to trial for response. Given possibility/concern for possible stroke, would avoid triptans for migraine rescue. -Discussed daily aspirin. While there is no evidence of stroke, patient is in favor of taking and has no history or concern for bleeds, so okay from my standpoint to take. If she decides to take it, would recommend aspirin 81 mg daily.   -Return to clinic on 10/10/24 at 11:30 am  The impression above as well as the plan as outlined below were extensively discussed with the patient who voiced understanding. All questions were answered to their satisfaction.  When available, results of the above investigations and possible further recommendations will be communicated to the patient via telephone/MyChart. Patient to call office if not contacted after expected testing turnaround time.   Total time spent reviewing records, interview, history/exam, documentation, and coordination of care on day of encounter:  80 min   Thank you for allowing me to participate in patient's care.  If I can answer any additional questions, I would be pleased to do so.  Venetia Potters, MD   CC: Mahlon Comer BRAVO, MD [id number] [place] [address]  CC: Referring provider: Mahlon Comer BRAVO, MD [phone]  [place] [address]

## 2024-08-27 NOTE — Telephone Encounter (Signed)
 Could you assist patient in her referral? Thank you

## 2024-08-28 ENCOUNTER — Ambulatory Visit: Admitting: Neurology

## 2024-08-28 ENCOUNTER — Other Ambulatory Visit

## 2024-08-28 ENCOUNTER — Encounter: Payer: Self-pay | Admitting: Neurology

## 2024-08-28 VITALS — BP 125/84 | HR 92 | Ht 64.5 in | Wt 168.0 lb

## 2024-08-28 DIAGNOSIS — Z8669 Personal history of other diseases of the nervous system and sense organs: Secondary | ICD-10-CM | POA: Diagnosis not present

## 2024-08-28 DIAGNOSIS — R5383 Other fatigue: Secondary | ICD-10-CM | POA: Diagnosis not present

## 2024-08-28 DIAGNOSIS — H539 Unspecified visual disturbance: Secondary | ICD-10-CM | POA: Diagnosis not present

## 2024-08-28 DIAGNOSIS — H532 Diplopia: Secondary | ICD-10-CM

## 2024-08-28 DIAGNOSIS — F419 Anxiety disorder, unspecified: Secondary | ICD-10-CM | POA: Diagnosis not present

## 2024-08-28 DIAGNOSIS — R4189 Other symptoms and signs involving cognitive functions and awareness: Secondary | ICD-10-CM | POA: Diagnosis not present

## 2024-08-28 MED ORDER — NURTEC 75 MG PO TBDP: ORAL_TABLET | ORAL | Status: AC

## 2024-08-28 NOTE — Patient Instructions (Addendum)
 I saw you today for the vision changes in the left eye, numbness of the left face, and cognitive changes.  I am not sure the cause of your symptoms, but your brain and blood vessel imaging looks good without clear concern for stroke. TIA would not make sense given that you symptoms lasted or are lasting longer than 24 hours.  If you would like to take aspirin 81 mg daily just in case, I do not have objections to this though.  I will investigate further today with lab work. I will be in touch when I have those results.  I want you to see your eye doctor to make sure all is well with your left eye.  I want you to discuss hormone replacements that you stopped with your primary care or OBGYN as this could also contribute to recent cognitive changes.  Finally, I am giving you some samples of Nurtec to take if you have that C in your vision or signs of migraine. If Nurtec helps, this could indicate migraine is the cause.  If you have new difficulty speaking, face droop, numbness on one side of the body, weakness on one side of the body, or dizziness/imbalance, this could be the sign of a stroke. Don't wait, please call EMS and be evaluated at the nearest emergency room.   I will see you back in clinic on 10/10/24 at 11:30 am. Make this appointment at our front desk before leaving today.  Please let me know if you have any questions or concerns in the meantime.  The physicians and staff at [facility] are committed to providing excellent care. You may receive a survey requesting feedback about your experience at our office. We strive to receive very good responses to the survey questions. If you feel that your experience would prevent you from giving the office a very good  response, please contact our office to try to remedy the situation. We may be reached at [phone]. Thank you for taking the time out of your busy day to complete the survey.  Venetia Potters, MD [facility]

## 2024-08-28 NOTE — Telephone Encounter (Signed)
 I know referral was placed but patient has not heard from anyone and she is concerned. I will keep checking for updates

## 2024-09-03 ENCOUNTER — Ambulatory Visit: Payer: Self-pay | Admitting: Neurology

## 2024-09-03 LAB — COMPREHENSIVE METABOLIC PANEL WITH GFR
AG Ratio: 2.2 (calc) (ref 1.0–2.5)
ALT: 57 U/L — ABNORMAL HIGH (ref 6–29)
AST: 31 U/L (ref 10–35)
Albumin: 4.9 g/dL (ref 3.6–5.1)
Alkaline phosphatase (APISO): 87 U/L (ref 37–153)
BUN: 14 mg/dL (ref 7–25)
CO2: 28 mmol/L (ref 20–32)
Calcium: 10 mg/dL (ref 8.6–10.4)
Chloride: 103 mmol/L (ref 98–110)
Creat: 0.71 mg/dL (ref 0.50–1.03)
Globulin: 2.2 g/dL (ref 1.9–3.7)
Glucose, Bld: 93 mg/dL (ref 65–99)
Potassium: 4.4 mmol/L (ref 3.5–5.3)
Sodium: 139 mmol/L (ref 135–146)
Total Bilirubin: 0.4 mg/dL (ref 0.2–1.2)
Total Protein: 7.1 g/dL (ref 6.1–8.1)
eGFR: 102 mL/min/1.73m2 (ref >=60)

## 2024-09-03 LAB — ADVANCED WRITTEN NOTIFICATION (AWN) TEST REFUSAL

## 2024-09-03 LAB — VITAMIN B12: Vitamin B-12: 325 pg/mL (ref 200–1100)

## 2024-09-03 LAB — MYASTHENIA GRAVIS PANEL 2
A CHR BINDING ABS: <0.3 nmol/L
ACHR Blocking Abs: <15 %{inhibition} (ref <15)
Acetylchol Modul Ab: 9 %{inhibition}

## 2024-09-03 LAB — VITAMIN D 25 HYDROXY (VIT D DEFICIENCY, FRACTURES): Vit D, 25-Hydroxy: 35 ng/mL (ref 30–100)

## 2024-09-03 LAB — TSH: TSH: 2.54 m[IU]/L

## 2024-09-03 LAB — VITAMIN B1: Vitamin B1 (Thiamine): 11 nmol/L (ref 8–30)

## 2024-09-04 ENCOUNTER — Encounter: Payer: Self-pay | Admitting: Family Medicine

## 2024-09-04 ENCOUNTER — Ambulatory Visit: Admitting: Family Medicine

## 2024-09-04 VITALS — BP 110/76 | HR 68 | Temp 98.0°F | Wt 169.8 lb

## 2024-09-04 DIAGNOSIS — R29818 Other symptoms and signs involving the nervous system: Secondary | ICD-10-CM

## 2024-09-04 DIAGNOSIS — F419 Anxiety disorder, unspecified: Secondary | ICD-10-CM | POA: Diagnosis not present

## 2024-09-04 DIAGNOSIS — Z23 Encounter for immunization: Secondary | ICD-10-CM

## 2024-09-04 MED ORDER — FLUOXETINE HCL 10 MG PO CAPS
10.0000 mg | ORAL_CAPSULE | Freq: Every day | ORAL | 3 refills | Status: AC
Start: 2024-09-04 — End: ?

## 2024-09-04 NOTE — Progress Notes (Signed)
   Subjective:    Patient ID: Sheryl Nichols, female    DOB: 1971/03/10, 53 y.o.   MRN: [id number]  HPI ER f/u- pt presented to the ER on 10/29 for evaluation of a possible CVA when she developed blurry vision, dizziness, facial numbness, and tingling in her hand.  Earlier that day she developed kaleidoscope vision out of her L eye.  It was felt that sxs were due to a complex migraine and she was transferred for MRI after CT and CTA were negative.  MRI was negative for stroke.  She was referred to Neurology for outpt evaluation.  Saw Neuro on 11/5 and was given samples of Nurtec.  She is to avoid triptans given the possibility of occult CVA.  She also started daily ASA 81mg .    Pt reports vision is improving, numbness is improving.  Feels sxs may be stress related.  Reports anxiety level is high.  Is seeing a therapist.  Reports she is emotionally exhausted.  Has cut out caffeine and decreased alcohol intake.  Stopped HRT.     Review of Systems For ROS see HPI     Objective:   Physical Exam Vitals reviewed.  Constitutional:      General: She is not in acute distress.    Appearance: Normal appearance. She is well-developed. She is not ill-appearing.  HENT:     Head: Normocephalic and atraumatic.  Eyes:     Conjunctiva/sclera: Conjunctivae normal.     Pupils: Pupils are equal, round, and reactive to light.  Neck:     Thyroid : No thyromegaly.  Cardiovascular:     Rate and Rhythm: Normal rate and regular rhythm.     Heart sounds: Normal heart sounds. No murmur heard. Pulmonary:     Effort: Pulmonary effort is normal. No respiratory distress.     Breath sounds: Normal breath sounds.  Abdominal:     General: There is no distension.     Palpations: Abdomen is soft.     Tenderness: There is no abdominal tenderness.  Musculoskeletal:     Cervical back: Normal range of motion and neck supple.  Lymphadenopathy:     Cervical: No cervical adenopathy.  Skin:    General: Skin is warm  and dry.  Neurological:     Mental Status: She is alert and oriented to person, place, and time.  Psychiatric:        Behavior: Behavior normal.           Assessment & Plan:  Transient neuro deficit- new to provider.  She was evaluated in the ER and has since seen Neuro for evaluation.  Working dx is that she had a complex migraine.  She was given Nurtec to use prn.  Stressed that if she again had sxs she would need to go back to the ER b/c you don't want to miss a possible stroke.  Pt expressed understanding and is in agreement w/ plan.

## 2024-09-04 NOTE — Patient Instructions (Addendum)
 Follow up in 4 weeks to recheck mood (can be virtual) START the Fluoxetine once daily Make sure you are taking time for yourself to help manage your stress levels Call with any questions or concerns Stay Safe!  Stay Healthy! Hang In There!

## 2024-09-22 DIAGNOSIS — F419 Anxiety disorder, unspecified: Secondary | ICD-10-CM | POA: Insufficient documentation

## 2024-09-22 NOTE — Assessment & Plan Note (Signed)
 New.  Pt reports high levels of stress and feels that this may be contributing to some of her neuro sxs.  Is already in therapy and would like to start medication.  Will start low dose Fluoxetine  and monitor for improvement.  Pt expressed understanding and is in agreement w/ plan.

## 2024-09-27 ENCOUNTER — Telehealth: Payer: Self-pay

## 2024-09-27 ENCOUNTER — Other Ambulatory Visit: Payer: Self-pay | Admitting: Family Medicine

## 2024-09-27 DIAGNOSIS — F902 Attention-deficit hyperactivity disorder, combined type: Secondary | ICD-10-CM

## 2024-09-27 NOTE — Telephone Encounter (Signed)
 Pt is requesting alternative inhaler states symbicort  is hard on her heart

## 2024-09-27 NOTE — Telephone Encounter (Signed)
 I'm not sure what she means 'hard on her heart'.  I would need more information/details in order to determine next steps

## 2024-09-27 NOTE — Telephone Encounter (Signed)
 Copied from CRM [id number]. Topic: Clinical - Medication Question >> Sep 27, 2024  2:30 PM Burnard DEL wrote: Reason for CRM: Patient would like to know if she could be switched to a different inhaler?She stated that the symbicort  is hard on her heart. Please advise.

## 2024-09-27 NOTE — Telephone Encounter (Signed)
 Copied from CRM #[id number]. Topic: Clinical - Medication Refill >> Sep 27, 2024  2:28 PM Burnard DEL wrote: Medication: amphetamine -dextroamphetamine (ADDERALL XR) 25 MG 24 hr capsule  Has the patient contacted their pharmacy? No (Agent: If no, request that the patient contact the pharmacy for the refill. If patient does not wish to contact the pharmacy document the reason why and proceed with request.) (Agent: If yes, when and what did the pharmacy advise?)  This is the patient's preferred pharmacy:  [id number] - [place], [place] - [address] [facility] [address] & [address] [place] [address] Phone: [phone] Fax: [phone]  Is this the correct pharmacy for this prescription? Yes If no, delete pharmacy and type the correct one.   Has the prescription been filled recently? No  Is the patient out of the medication? No  Has the patient been seen for an appointment in the last year OR does the patient have an upcoming appointment? Yes  Can we respond through MyChart? Yes  Agent: Please be advised that Rx refills may take up to 3 business days. We ask that you follow-up with your pharmacy.

## 2024-09-30 ENCOUNTER — Telehealth: Payer: Self-pay | Admitting: Family Medicine

## 2024-09-30 MED ORDER — AMPHETAMINE-DEXTROAMPHET ER 25 MG PO CP24: 25.0000 mg | ORAL_CAPSULE | ORAL | 0 refills | Status: AC

## 2024-09-30 NOTE — Telephone Encounter (Signed)
 LM to call back so we could obtain more information on how it is this is hard on her heart and how she came to this information

## 2024-09-30 NOTE — Telephone Encounter (Signed)
 Requested Prescriptions   Pending Prescriptions Disp Refills   amphetamine -dextroamphetamine (ADDERALL XR) 25 MG 24 hr capsule 30 capsule 0    Sig: Take 1 capsule by mouth every morning.     Date of patient request: 09/30/24 Last office visit: 09/04/2024 Upcoming visit: 10/02/2024 Date of last refill: 08/22/24 Last refill amount: 30

## 2024-09-30 NOTE — Telephone Encounter (Unsigned)
 Copied from CRM #[id number]. Topic: Clinical - Refused Triage >> Sep 30, 2024  4:36 PM Alfonso ORN wrote: pt called back to f/u on what pcp asked regarding heart medication and stated that she has had more complications with medication and her heart has been racing faster  . Declined transfer to triage.

## 2024-09-30 NOTE — Progress Notes (Signed)
 NEUROLOGY FOLLOW UP OFFICE NOTE  [name] [id number]  Subjective:  Sheryl Nichols is a 53 y.o. year old right-handed female with a medical history of HLD, ADHD, asthma, carpal tunnel syndrome, remote history of migraines who we last saw on 08/28/24 for vision changes in left eye, numbness of left face, and cognitive changes.  To briefly review: 08/28/24: Patient presented to ED on 08/21/24 with blurry vision, dizziness, facial numbness, and tingling in her hand. It started suddenly with kaleidoscope vision changes out of the left side of eye. She was seeing the letter C in her vision. It was only in the left eye. She then had numbness on the left side of her face (upper lip most but even on the cheek). She had pain and odd sensation in her ear/TMJ area.  Patient had dizziness once she was at the ED, but she thinks drowsiness was a better description. She then had tingling in her left hand.   Symptoms started resolving in the order it started per patient. The vision changes with the C in her vision lasted about 4 hours. She thought the vision was completely normal (told them that at the hospital). When she woke up the next morning, she feels there is a shadow or hair in the left upper lateral quadrant that she feels like she can't get rid of. She denies not being able to see in that area and does not think she has a visual field cut.   She was seen by Dr. Jerri in neurology who felt symptoms were more consistent with complex migraine. She never had a headache, photophobia, phonophobia, or nausea. Per notes, she had migraines in the past during pregnancy with nausea, vomiting and phonophobia. No headache in the last 22 years though.   CT head, CTA head and neck, and MRI brain were all normal.   Since the ED visit, she has also had some cognitive issues. Patient's roommate provided notes to patient to read to me.  This note mentions her words could be slurred, her word finding  difficulty, processing speed, and retelling of events was broken up. She was really ADHD at the [facility] party and seemed all over the place to guests.   Patient has noticed having a hard time concentrating on what people are saying. She is in school feels that her work at school has been affected. She feels confused. She has been very tired. She attributes this to cutting out caffeine and EtOH since the ED visit. She endorses good sleep and if anything, sleep has been more than before the ED visit.   In terms of mood, she does endorse being very anxious and on edge, worried about the next shoe to drop. This started in 05/2024 when she started school. She is seeing a therapist every other week, but unsure if this is helping. She does think she has attention problems.   She also mentions her head feels full. She thinks it is dull pressure and does not really hurt.   Smoker: No OCP/hormone use: Estrogen patch and progesterone pills for hot flashes. She stopped these after the recent ED visit.  Caffiene use: Stopped recently after ED, but prior to this 1-2 cups of coffee per day EtOH use: Stopped recently after ED, but prior was drinking 1-2 glasses of wine per night Restrictive diet: Not eating as well since starting school. She has not lost weight, but gained weight. Family history of neurologic disease including headaches: daughter with migraines as teenager; sister  may get headaches   She sees an eye doctor yearly, last 04/14/24. Everything looked good at that time per patient.  Most recent Assessment and Plan (08/28/24): Sheryl Nichols is a 53 y.o. female who presents for evaluation of vision changes in left eye, numbness of left face, and cognitive changes. She has a relevant medical history of HLD, ADHD, asthma, carpal tunnel syndrome, remote history of migraines. Her neurological examination is pertinent for diminished sensation in V2 and V3 distribution of left face and diplopia  with up gaze. Available diagnostic data is significant for normal CT head, CTA head and neck, and MRI brain. The etiology of symptoms is currently unclear and difficult to localize. The C in her vision followed by other symptoms sound like migraine with aura. She has a remote history of migraine, but no headaches in over 20 years and no headache with this episode, so this is possible but unclear. She had no other typical migraine symptoms either. MRI brain and CTA head and neck are not concerning for ischemia (stroke or TIA) but again, this is possible. I would expect the facial numbness to be more dense than just part of the face though. I also would not expect the aura symptom prior to onset. She made many changes after the episode on 08/21/24 including stopping her hormone replacement suddenly, cutting out caffeine and alcohol. She also has anxiety that has been worse after 08/21/24 episode. It is possible that these other changes contributed to fatigue and cognitive changes. A primary eye problem is also possible given that changes are isolated to left eye, but this would not explain facial numbness. Myasthenia gravis is also possible, but again would not cause facial numbness. I will attempt to clarify things with the work up below and follow patient closely.   PLAN: -Blood work: B1, B12, TSH, vit D, MG panel (AChR abs), CMP -Recommend patient see her eye doctor for symptoms as well -Recommend she discuss hormone therapy with PCP or OBGYN -Stroke warning signs discussed -Will give sample of Nurtec 75 mg daily today to try to take if she has symptoms again to trial for response. Given possibility/concern for possible stroke, would avoid triptans for migraine rescue. -Discussed daily aspirin. While there is no evidence of stroke, patient is in favor of taking and has no history or concern for bleeds, so okay from my standpoint to take. If she decides to take it, would recommend aspirin 81 mg  daily.  Since their last visit: Labs showed a borderline B12 level. I recommended supplementation with B12 1000 mcg daily.  She has had no further episodes and has not had to try to take the Nurtec.   She has no new complaints today.    MEDICATIONS:  Outpatient Encounter Medications as of 10/10/2024  Medication Sig Note   albuterol  (VENTOLIN  HFA) 108 (90 Base) MCG/ACT inhaler INHALE 1 TO 2 PUFFS BY MOUTH EVERY 4 TO 6 HOURS AS NEEDED FOR WHEEZING OR SHORTNESS OF BREATH    amphetamine -dextroamphetamine (ADDERALL XR) 25 MG 24 hr capsule Take 1 capsule by mouth every morning.    beclomethasone (QVAR  REDIHALER) 80 MCG/ACT inhaler Inhale 1 puff into the lungs 2 (two) times daily.    cetirizine  (ZYRTEC ) 10 MG tablet TAKE 1 TABLET(10 MG) BY MOUTH DAILY    fluticasone  (FLONASE ) 50 MCG/ACT nasal spray SHAKE LIQUID AND USE 2 SPRAYS IN EACH NOSTRIL DAILY    FLUoxetine  (PROZAC ) 10 MG capsule Take 1 capsule (10 mg total) by mouth daily. (Patient  not taking: Reported on 10/10/2024)    Rimegepant Sulfate (NURTEC) 75 MG TBDP Medication Samples have been provided to the patient.  Drug name: Nurtec       Strength: 75mg         Qty: 2 boxes  LOT: [id number]  Exp.Date: 08/2027  Dosing instructions:   The patient has been instructed regarding the correct time, dose, and frequency of taking this medication, including desired effects and most common side effects.   Mahina A Allen 12:11 PM 08/28/2024 (Patient not taking: Reported on 10/10/2024)    [DISCONTINUED] SYMBICORT  160-4.5 MCG/ACT inhaler INHALE 2 PUFFS INTO THE LUNGS TWICE DAILY 10/02/2024: side effects   No facility-administered encounter medications on file as of 10/10/2024.    PAST MEDICAL HISTORY: Past Medical History:  Diagnosis Date   ADHD (attention deficit hyperactivity disorder), combined type 04/22/2021   Anemia 07/31/2007   Iron  deficiency ; onset as child pre menses; ?dietary deficiency related ( avoided meat , spinach, leafy  greens,etc)   Asthma 08/22/2009   Onset:as infant Triggers (environmental, infectious, allergic):allergens ; EIB Rescue inhaler ldz:mjmzob pre running Maintenance medications/ response:not used Smoking history:never Family history pulmonary disease: no   Carpal tunnel syndrome 06/04/2013   Improved with braces   Chronic rhinitis 09/13/2009   Dermatographic urticaria 09/06/2010   Heart murmur 07/31/2007   History of COVID-19 12/02/2020   History of Hashimoto thyroiditis    History of tympanoplasty 02/28/2023   Hyperhidrosis 02/21/2014   Lumbar radiculopathy 02/19/2021   Migraines    resolved off sugar   Recurrent urinary tract infection 08/22/2019    PAST SURGICAL HISTORY: Past Surgical History:  Procedure Laterality Date   APPENDECTOMY     BREAST ENHANCEMENT SURGERY     BREAST REDUCTION SURGERY     with implant removal   COLONOSCOPY  2009   for rectal bleeding; IH found   SEPTOPLASTY     sinus cystectomy   TONSILLECTOMY AND ADENOIDECTOMY     TYMPANOPLASTY Bilateral    WISDOM TOOTH EXTRACTION      ALLERGIES: Allergies  Allergen Reactions   Codeine     REACTION: tingling all over; / nausea & vomiting   Latex Hives    FAMILY HISTORY: Family History  Problem Relation Age of Onset   Hyperlipidemia Mother    Hypertension Mother    Heart attack Mother 58   Hypothyroidism Mother    Cancer Maternal Grandfather        Brain Cancer   Diabetes Neg Hx    Stroke Neg Hx    Stomach cancer Neg Hx    Rectal cancer Neg Hx    Esophageal cancer Neg Hx    Colon cancer Neg Hx    Colon polyps Neg Hx     SOCIAL HISTORY: Social History   Tobacco Use   Smoking status: Never   Smokeless tobacco: Never  Vaping Use   Vaping status: Never Used  Substance Use Topics   Alcohol use: Yes    Alcohol/week: 14.0 - 21.0 standard drinks of alcohol    Types: 14 - 21 Shots of liquor per week    Comment: 1-2 wine 3 time a week   Drug use: No   Social History   Social History  Narrative   Born in [place], moved to [place]  as a teen   Separated-husband is transport planner, 4 children   Are you right handed or left handed? Right    Are you currently employed ?    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   Who lives with you? Roommate and child   What type of home do you live in: 1 story or 2 story? two    Caffiene none      Objective:  Vital Signs:  BP 109/76   Pulse 75   Ht 5' 4.5 (1.638 m)   Wt 172 lb (78 kg)   SpO2 97%   BMI 29.07 kg/m   General: No acute distress.  Patient appears well-groomed.   Head:  Normocephalic/atraumatic Neck: supple Heart:  Regular rate and rhythm Lungs:  Clear to auscultation bilaterally Neurological Exam: alert and oriented.  Speech fluent and not dysarthric, language intact.  CN II-XII intact. Bulk and tone normal, muscle strength 5/5 throughout.  Sensation to light touch intact.  Deep tendon reflexes 2+ throughout.  Finger to nose testing intact.  Gait normal.   Labs and Imaging review: New results: 08/28/24: AChR binding, blocking, modulating abs negative CMP significant for ALT 57 Vit D wnl TSH wnl B1 wnl B12: 325  Previously reviewed results: 08/21/24: CMP significant for ALT 70 CBC unremarkable   06/10/24: Vit D wnl TSH wnlssssssss Lipid panel: tChol 216, LDL 124, TG 94.0   HbA1c (6/71/74): 5.6   Imaging/Procedures: MRI lumbar spine wo contrast (01/11/2007): IMPRESSION:  1. Broad-based central disk protrusion at L5-S1 results in possible encroachment on both S1 nerve roots.   2. No other acquired abnormalities. The right L4 and L5 nerve root sleeves appear conjoined.    CT head wo contrast (08/21/24): IMPRESSION: 1. No acute intracranial abnormality. 2. ASPECTS 10.   CTA head and neck (08/21/24): No evidence of large vessel occlusion or significant stenosis.    MRI brain wo contrast (08/21/24): Normal brain MRI. No acute intracranial abnormality.   Assessment/Plan:   This is Rashauna Roes, a 53 y.o. female with transient symptoms on 08/21/24 with blurry vision, dizziness, facial numbness, and tingling in her hand. There was kaleidoscope vision changes out of the left side of eye and seeing the letter C in her vision. This could be consistent with migraine, though this is unclear. MRI brain was normal, as was CTA head and neck. Fortunately, she has had no further episodes.  Plan: -Stroke warning signs discussed -Patient will call with any new symptoms -B12 1000 mcg daily for borderline low B12  Return to clinic as needed  Venetia Potters, MD

## 2024-10-01 NOTE — Telephone Encounter (Signed)
 Patient called back. Her heart has been racing when she uses her inhaler.

## 2024-10-01 NOTE — Telephone Encounter (Signed)
 Patient called back. Her heart has been racing when she uses her inhaler. Closing thread as a new one has been started and sent to Dr. Mahlon.

## 2024-10-01 NOTE — Telephone Encounter (Signed)
 Typically it is albuterol  that causes the heart to race and not Symbicort  or other controller inhalers.  She was previously on Qvar - did she tolerate that?

## 2024-10-01 NOTE — Telephone Encounter (Signed)
 Called patient and left vm to return call to get more information on what she means

## 2024-10-02 ENCOUNTER — Ambulatory Visit: Admitting: Family Medicine

## 2024-10-02 ENCOUNTER — Encounter: Payer: Self-pay | Admitting: Family Medicine

## 2024-10-02 VITALS — BP 122/60 | HR 67 | Temp 97.9°F | Resp 18 | Ht 64.5 in | Wt 173.0 lb

## 2024-10-02 DIAGNOSIS — F419 Anxiety disorder, unspecified: Secondary | ICD-10-CM | POA: Diagnosis not present

## 2024-10-02 DIAGNOSIS — J4540 Moderate persistent asthma, uncomplicated: Secondary | ICD-10-CM

## 2024-10-02 MED ORDER — QVAR REDIHALER 80 MCG/ACT IN AERB: 1.0000 | INHALATION_SPRAY | Freq: Two times a day (BID) | RESPIRATORY_TRACT | 3 refills | Status: AC

## 2024-10-02 NOTE — Telephone Encounter (Signed)
Discussed at appt today

## 2024-10-02 NOTE — Patient Instructions (Signed)
 Schedule your complete physical in August STOP the Symbicort  RESTART the Qvar - if it's too expensive, let me know Keep me updated on your anxiety Call with any questions or concerns Stay Safe!  Stay Healthy! Happy Holidays!!!

## 2024-10-02 NOTE — Progress Notes (Unsigned)
° °  Subjective:    Patient ID: Sheryl Nichols, female    DOB: 08-15-1971, 53 y.o.   MRN: [id number]  HPI Anxiety- at last visit we started Fluoxetine  10mg  daily.  Since last visit, has not had another migraine.  Today is the last day she will see the woman that has been causing such high stress levels.  She wants to keep meds where they are right now.  Medication side effects- having palpitations on Symbicort .  Previously did well on Qvar  but had difficulty w/ insurance coverage.    Review of Systems For ROS see HPI     Objective:   Physical Exam Vitals reviewed.  Constitutional:      General: She is not in acute distress.    Appearance: Normal appearance. She is not ill-appearing.  HENT:     Head: Normocephalic and atraumatic.  Eyes:     Extraocular Movements: Extraocular movements intact.     Conjunctiva/sclera: Conjunctivae normal.  Cardiovascular:     Rate and Rhythm: Normal rate and regular rhythm.  Pulmonary:     Effort: Pulmonary effort is normal. No respiratory distress.     Breath sounds: No wheezing or rhonchi.  Skin:    General: Skin is warm and dry.  Neurological:     General: No focal deficit present.     Mental Status: She is alert and oriented to person, place, and time.  Psychiatric:        Mood and Affect: Mood normal.        Behavior: Behavior normal.        Thought Content: Thought content normal.           Assessment & Plan:

## 2024-10-05 NOTE — Assessment & Plan Note (Signed)
 Ongoing issue.  Reports palpitations on Symbicort .  Would like to restart Qvar  if insurance covers.  Prescription sent.

## 2024-10-05 NOTE — Assessment & Plan Note (Signed)
 Improved since starting Fluoxetine  10mg  daily.  Has not had a migraine since starting medication.  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  Will hold on medication changes at this time based on pt preference.

## 2024-10-10 ENCOUNTER — Ambulatory Visit: Admitting: Neurology

## 2024-10-10 VITALS — BP 109/76 | HR 75 | Ht 64.5 in | Wt 172.0 lb

## 2024-10-10 DIAGNOSIS — Z8669 Personal history of other diseases of the nervous system and sense organs: Secondary | ICD-10-CM

## 2024-10-10 DIAGNOSIS — E538 Deficiency of other specified B group vitamins: Secondary | ICD-10-CM

## 2024-10-10 DIAGNOSIS — R29818 Other symptoms and signs involving the nervous system: Secondary | ICD-10-CM | POA: Diagnosis not present

## 2024-10-10 DIAGNOSIS — F419 Anxiety disorder, unspecified: Secondary | ICD-10-CM | POA: Diagnosis not present

## 2024-10-10 NOTE — Patient Instructions (Addendum)
 Please let me know if you have any questions or concerns.  Your B12 was borderline low. Given your symptoms, I would recommend supplementing with B12 1000 mcg daily. This can be bought over the counter at any local drug store or online.   The physicians and staff at [facility] are committed to providing excellent care. You may receive a survey requesting feedback about your experience at our office. We strive to receive very good responses to the survey questions. If you feel that your experience would prevent you from giving the office a very good  response, please contact our office to try to remedy the situation. We may be reached at [phone]. Thank you for taking the time out of your busy day to complete the survey.  Venetia Potters, MD [facility]

## 2024-11-04 ENCOUNTER — Encounter: Payer: Self-pay | Admitting: *Deleted

## 2024-11-14 ENCOUNTER — Other Ambulatory Visit: Payer: Self-pay | Admitting: Family Medicine

## 2024-11-14 DIAGNOSIS — J310 Chronic rhinitis: Secondary | ICD-10-CM

## 2024-12-05 ENCOUNTER — Ambulatory Visit: Admitting: Neurology

## 2025-06-11 ENCOUNTER — Encounter: Admitting: Family Medicine
